# Patient Record
Sex: Male | Born: 1990 | Race: Black or African American | Hispanic: No | Marital: Single | Smoking: Current every day smoker
Health system: Southern US, Community
[De-identification: ages and names within clinical notes are randomized; demographics above are authoritative.]

## PROBLEM LIST (undated history)

## (undated) DIAGNOSIS — J45909 Unspecified asthma, uncomplicated: Secondary | ICD-10-CM

---
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
## 2011-01-16 ENCOUNTER — Encounter: Payer: Self-pay | Admitting: Internal Medicine

## 2011-01-21 ENCOUNTER — Other Ambulatory Visit: Payer: Commercial Managed Care - PPO | Admitting: Internal Medicine

## 2011-01-21 DIAGNOSIS — Z0000 Encounter for general adult medical examination without abnormal findings: Secondary | ICD-10-CM

## 2011-01-21 LAB — COMPREHENSIVE METABOLIC PANEL
ALT: 13 U/L (ref 0–53)
AST: 13 U/L (ref 0–37)
Albumin: 4.5 g/dL (ref 3.5–5.2)
Alkaline Phosphatase: 62 U/L (ref 39–117)
BUN: 14 mg/dL (ref 6–23)
Creat: 0.9 mg/dL (ref 0.50–1.35)
Total Bilirubin: 0.3 mg/dL (ref 0.3–1.2)
Total Protein: 7 g/dL (ref 6.0–8.3)

## 2011-01-21 LAB — CBC WITH DIFFERENTIAL/PLATELET
Basophils Absolute: 0 10*3/uL (ref 0.0–0.1)
Basophils Relative: 0 % (ref 0–1)
Eosinophils Absolute: 0.4 10*3/uL (ref 0.0–0.7)
Eosinophils Relative: 6 % — ABNORMAL HIGH (ref 0–5)
HCT: 45.2 % (ref 39.0–52.0)
Hemoglobin: 15.4 g/dL (ref 13.0–17.0)
Lymphs Abs: 2.7 10*3/uL (ref 0.7–4.0)
MCHC: 34.1 g/dL (ref 30.0–36.0)
MCV: 96.2 fL (ref 78.0–100.0)
Monocytes Absolute: 0.5 10*3/uL (ref 0.1–1.0)
Monocytes Relative: 7 % (ref 3–12)
Neutro Abs: 3.4 10*3/uL (ref 1.7–7.7)
Neutrophils Relative %: 48 % (ref 43–77)
Platelets: 241 10*3/uL (ref 150–400)
RBC: 4.7 MIL/uL (ref 4.22–5.81)
RDW: 14 % (ref 11.5–15.5)

## 2011-01-21 LAB — LIPID PANEL
Cholesterol: 133 mg/dL (ref 0–200)
HDL: 44 mg/dL (ref >39)
LDL Cholesterol: 81 mg/dL (ref 0–99)
Total CHOL/HDL Ratio: 3 Ratio
Triglycerides: 41 mg/dL (ref <150)

## 2011-01-22 ENCOUNTER — Ambulatory Visit (INDEPENDENT_AMBULATORY_CARE_PROVIDER_SITE_OTHER): Payer: Commercial Managed Care - PPO | Admitting: Internal Medicine

## 2011-01-22 ENCOUNTER — Encounter: Payer: Self-pay | Admitting: Internal Medicine

## 2011-01-22 VITALS — BP 118/72 | HR 82 | Temp 99.1°F | Ht 67.0 in | Wt 147.0 lb

## 2011-01-22 DIAGNOSIS — M25559 Pain in unspecified hip: Secondary | ICD-10-CM

## 2011-01-22 DIAGNOSIS — R52 Pain, unspecified: Secondary | ICD-10-CM

## 2011-01-22 DIAGNOSIS — Z0000 Encounter for general adult medical examination without abnormal findings: Secondary | ICD-10-CM

## 2011-01-22 DIAGNOSIS — Z23 Encounter for immunization: Secondary | ICD-10-CM

## 2011-02-10 ENCOUNTER — Encounter: Payer: Self-pay | Admitting: Internal Medicine

## 2011-02-10 DIAGNOSIS — J45909 Unspecified asthma, uncomplicated: Secondary | ICD-10-CM | POA: Insufficient documentation

## 2011-02-10 NOTE — Patient Instructions (Signed)
Takes Celebrex daily for 10 days. Call if hip pain does not resolve in 2 weeks. Please obtain x-ray of left hip. Order given. Please consider stopping smoking.

## 2011-02-10 NOTE — Progress Notes (Signed)
  Subjective:    Patient ID: Raymond Neal, male    DOB: 1990/10/06, 20 y.o.   MRN: [id number]  HPI first visit for this 20 year old male for health maintenance. History of asthma and uses albuterol inhaler when necessary. Smokes approximately 1/3 pack of cigarettes daily. Complaining of some  hip pain present for several weeks. No known injury. No known allergies.    Review of Systems aside from hip pain and asthma noncontributory     Objective:   Physical Exam  Vitals reviewed. Constitutional: He is oriented to person, place, and time. He appears well-developed and well-nourished.  HENT:  Head: Normocephalic and atraumatic.  Right Ear: External ear normal.  Left Ear: External ear normal.  Mouth/Throat: Oropharynx is clear and moist.  Eyes: EOM are normal. Pupils are equal, round, and reactive to light. No scleral icterus.  Neck: Neck supple. No thyromegaly present.  Cardiovascular: Normal rate, regular rhythm and normal heart sounds.   No murmur heard. Pulmonary/Chest: Effort normal and breath sounds normal. He has no wheezes. He has no rales.  Abdominal: Soft. Bowel sounds are normal. He exhibits no distension and no mass. There is no tenderness.  Genitourinary:       No hernia to direct palpation  Musculoskeletal:       Some pain with internal rotation of hip  Lymphadenopathy:    He has no cervical adenopathy.  Neurological: He is alert and oriented to person, place, and time. He has normal reflexes. No cranial nerve deficit. Coordination normal.  Skin: Skin is warm and dry. He is not diaphoretic.  Psychiatric: He has a normal mood and affect. His behavior is normal.          Assessment & Plan:  History of asthma  History of smoking  History of hip pain-likely musculoskeletal rule out occult hip problem with x-ray  Plan: Samples of Celebrex 200 mg daily for 10 days. Obtain x-ray of hip. To orthopedist if hip problem does not improve

## 2011-02-14 ENCOUNTER — Ambulatory Visit: Payer: Commercial Managed Care - PPO | Admitting: Internal Medicine

## 2011-10-20 ENCOUNTER — Emergency Department (HOSPITAL_COMMUNITY)
Admission: EM | Admit: 2011-10-20 | Discharge: 2011-10-20 | Disposition: A | Discharge status: Home / Self Care | Payer: Self-pay | Attending: Emergency Medicine | Admitting: Emergency Medicine

## 2011-10-20 ENCOUNTER — Encounter (HOSPITAL_COMMUNITY): Payer: Self-pay

## 2011-10-20 DIAGNOSIS — F17200 Nicotine dependence, unspecified, uncomplicated: Secondary | ICD-10-CM | POA: Insufficient documentation

## 2011-10-20 DIAGNOSIS — J45909 Unspecified asthma, uncomplicated: Secondary | ICD-10-CM | POA: Insufficient documentation

## 2011-10-20 HISTORY — DX: Unspecified asthma, uncomplicated: J45.909

## 2011-10-20 MED ORDER — ALBUTEROL SULFATE HFA 108 (90 BASE) MCG/ACT IN AERS: 1.0000 | INHALATION_SPRAY | Freq: Four times a day (QID) | RESPIRATORY_TRACT | Status: DC | PRN

## 2011-10-20 MED ORDER — ALBUTEROL SULFATE HFA 108 (90 BASE) MCG/ACT IN AERS
2.0000 | INHALATION_SPRAY | RESPIRATORY_TRACT | Status: DC | PRN
  Administered 2011-10-20: 2 via RESPIRATORY_TRACT
  Filled 2011-10-20: qty 6.7

## 2011-10-20 NOTE — ED Provider Notes (Signed)
History  Scribed for Ethelda Chick, MD, the patient was seen in room TR11C/TR11C. This chart was scribed by Candelaria Stagers. The patient's care started at 3:22 PM   CSN: [id number]  Arrival date & time 10/20/11  1420   First MD Initiated Contact with Patient 10/20/11 1501      Chief Complaint  Patient presents with  . Asthma     The history is provided by the patient.   Raymond Neal is a 21 y.o. male who presents to the Emergency Department complaining of asthma exacerbation after running out of his inhaler yesterday.  He is experiencing cough and sob.  He denies fever.  Pt states that he uses his inhaler almost everyday.  He is taking no other medications.  There are no other associated systemic symptoms, there are no other alleviating or modifying factors. No fever, no nasal congestion, no chest pain or difficulty breathing.   Past Medical History  Diagnosis Date  . Asthma     No past surgical history on file.  No family history on file.  History  Substance Use Topics  . Smoking status: Current Everyday Smoker -- 0.3 packs/day    Types: Cigarettes  . Smokeless tobacco: Not on file  . Alcohol Use: Not on file      Review of Systems  Constitutional: Negative for fever.  Respiratory: Positive for cough and shortness of breath.   All other systems reviewed and are negative.    Allergies  Review of patient's allergies indicates no known allergies.  Home Medications   Current Outpatient Rx  Name Route Sig Dispense Refill  . ALBUTEROL SULFATE HFA 108 (90 BASE) MCG/ACT IN AERS Inhalation Inhale 2 puffs into the lungs every 6 (six) hours as needed. For shortness of breath.    . ALBUTEROL SULFATE HFA 108 (90 BASE) MCG/ACT IN AERS Inhalation Inhale 1-2 puffs into the lungs every 6 (six) hours as needed for wheezing. 1 Inhaler 0    BP 117/76  Pulse 70  Temp 98.3 F (36.8 C) (Oral)  Resp 19  SpO2 99%  Physical Exam  Nursing note and vitals  reviewed. Constitutional: He is oriented to person, place, and time. He appears well-developed and well-nourished. No distress.  HENT:  Head: Normocephalic and atraumatic.  Mouth/Throat: No oropharyngeal exudate.  Eyes: Conjunctivae are normal.  Pulmonary/Chest: Breath sounds normal. He has no wheezes.       No increased respiratory effort, good breath movement.   Musculoskeletal: Normal range of motion. He exhibits no tenderness.  Neurological: He is alert and oriented to person, place, and time.  Skin: Skin is warm and dry. He is not diaphoretic.  Psychiatric: He has a normal mood and affect. His behavior is normal.    ED Course  Procedures  DIAGNOSTIC STUDIES: Oxygen Saturation is 99% on room air, normal by my interpretation.    COORDINATION OF CARE:     Labs Reviewed - No data to display No results found.   1. Asthma       MDM  Pt with hx of asthma presenting with c/o feeling that he needs his inhaler and he has run out.  No active wheezing or active asthma exacerbation at this time.  Pt given albuterol MDI in ED as well as rx for albuterol.  Discharged with strict return precautions.  Pt agreeable with plan.  I personally performed the services described in this documentation, which was scribed in my presence. The recorded information has been reviewed and  considered.       Ethelda Chick, MD 10/20/11 [phone]

## 2011-10-20 NOTE — ED Notes (Signed)
sts feels on the verge of asthma attack and wants to catch it before it gets bad, onset yesterday.

## 2011-11-25 ENCOUNTER — Other Ambulatory Visit: Payer: Self-pay | Admitting: Internal Medicine

## 2011-11-25 ENCOUNTER — Ambulatory Visit (INDEPENDENT_AMBULATORY_CARE_PROVIDER_SITE_OTHER): Payer: Commercial Managed Care - PPO | Admitting: Internal Medicine

## 2011-11-25 ENCOUNTER — Encounter: Payer: Self-pay | Admitting: Internal Medicine

## 2011-11-25 VITALS — BP 110/72 | HR 84 | Temp 97.9°F | Ht 67.5 in | Wt 147.0 lb

## 2011-11-25 DIAGNOSIS — N489 Disorder of penis, unspecified: Secondary | ICD-10-CM

## 2011-11-25 DIAGNOSIS — R21 Rash and other nonspecific skin eruption: Secondary | ICD-10-CM

## 2011-11-25 DIAGNOSIS — Z113 Encounter for screening for infections with a predominantly sexual mode of transmission: Secondary | ICD-10-CM

## 2011-11-25 NOTE — Patient Instructions (Addendum)
Recommend using condoms with sexual activity. Apply cream to distal penis twice daily until rash is healed

## 2011-11-25 NOTE — Progress Notes (Signed)
  Subjective:    Patient ID: Karlyne Greenspan, male    DOB: Feb 20, 1991, 21 y.o.   MRN: [id number]  HPI 21 year old sexually active black male in today with rash on the distal shaft of penis. Had onset on Friday, August 16. Says it was uncomfortable and irritated. Did not itch but was more painful. Never had this before. Recent sexual activity did not include use of condoms. Wants to be tested for STDs    Review of Systems     Objective:   Physical Exam he has healing excoriated lesions distal shaft of penis in a cluster. No frank ulcerations noted. No penile discharge. GC and Chlamydia probes were taken. Herpes culture taken.        Assessment & Plan:  Lesions distal shaft of penis-rule out herpes simplex type II. Could possibly be tinea cruris.  Check for STDs  Plan: GC and Chlamydia probes taken in addition to herpes culture and HIV drawn. Prescribed Lotrisone cream 30 g to use on rash twice a day with refills. Advise patient to use condoms.  Counsel regarding sexually transmitted diseases

## 2011-11-25 NOTE — Addendum Note (Signed)
Addended by: Judy Pimple on: 11/25/2011 12:46 PM   Modules accepted: Orders

## 2011-11-26 ENCOUNTER — Telehealth: Payer: Self-pay

## 2011-11-26 LAB — GC/CHLAMYDIA PROBE AMP, GENITAL: Chlamydia, DNA Probe: POSITIVE — AB

## 2011-11-26 LAB — HIV ANTIBODY (ROUTINE TESTING W REFLEX): HIV: NONREACTIVE

## 2011-11-26 MED ORDER — DOXYCYCLINE HYCLATE 100 MG PO TABS: 100.0000 mg | ORAL_TABLET | Freq: Two times a day (BID) | ORAL | Status: AC

## 2011-11-26 NOTE — Telephone Encounter (Signed)
Patient informed of positive Chlamydia test, and the need for Doxycycline 100 mg bid x 10 days. Scheduled for test of cure on 12/12/2011. Advised him to inform his partner of this as well

## 2011-11-26 NOTE — Addendum Note (Signed)
Addended by: Judy Pimple on: 11/26/2011 04:33 PM   Modules accepted: Orders

## 2011-12-12 ENCOUNTER — Telehealth: Payer: Self-pay | Admitting: Internal Medicine

## 2011-12-12 ENCOUNTER — Ambulatory Visit: Payer: Commercial Managed Care - PPO | Admitting: Internal Medicine

## 2011-12-12 NOTE — Telephone Encounter (Signed)
Patient was recently diagnosed with Chlamydia and was treated with doxycycline for 10 days. Health Department was notified. He failed to keep appointment today. I contacted him today. Says he has had car trouble and has had his license revoked. Reminded him he needed test of q. her for Chlamydia. 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 Told him that that could recur at any time and we could offer him prophylactic treatment. He says he will reschedule at a later date.

## 2012-01-07 ENCOUNTER — Ambulatory Visit: Payer: Commercial Managed Care - PPO | Admitting: Internal Medicine

## 2012-01-08 ENCOUNTER — Telehealth: Payer: Self-pay | Admitting: Internal Medicine

## 2012-01-08 NOTE — Telephone Encounter (Signed)
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 He was supposed to have returned before  now for test of cure for gonorrhea and chlamydia. A report was filled out in sent to the health department in August when he was diagnosed with both Chlamydia and gonorrhea and treated. Asked patient if he could stay for test of cure  today but he said he needed to go pick up his baby at daycare. Was given prescription for Valtrex 500 mg #30 one by mouth daily generic with when necessary one year refills to prevent outbreaks of HSV type II. Says he's had a couple of recurrent outbreaks since being diagnosed in August. He agreed to come by the following day, October 1 for test of cure but did not keep that appointment

## 2012-04-08 HISTORY — PX: CLAVICLE SURGERY: SHX598

## 2012-07-17 ENCOUNTER — Encounter (HOSPITAL_COMMUNITY): Payer: Self-pay | Admitting: Emergency Medicine

## 2012-07-17 ENCOUNTER — Emergency Department (HOSPITAL_COMMUNITY)
Admission: EM | Admit: 2012-07-17 | Discharge: 2012-07-17 | Disposition: A | Discharge status: Home / Self Care | Payer: Commercial Managed Care - PPO | Attending: Emergency Medicine | Admitting: Emergency Medicine

## 2012-07-17 DIAGNOSIS — J45909 Unspecified asthma, uncomplicated: Secondary | ICD-10-CM

## 2012-07-17 DIAGNOSIS — J45901 Unspecified asthma with (acute) exacerbation: Secondary | ICD-10-CM | POA: Insufficient documentation

## 2012-07-17 DIAGNOSIS — F17200 Nicotine dependence, unspecified, uncomplicated: Secondary | ICD-10-CM | POA: Insufficient documentation

## 2012-07-17 DIAGNOSIS — Z79899 Other long term (current) drug therapy: Secondary | ICD-10-CM | POA: Insufficient documentation

## 2012-07-17 DIAGNOSIS — R05 Cough: Secondary | ICD-10-CM | POA: Insufficient documentation

## 2012-07-17 DIAGNOSIS — R059 Cough, unspecified: Secondary | ICD-10-CM | POA: Insufficient documentation

## 2012-07-17 MED ORDER — ALBUTEROL SULFATE HFA 108 (90 BASE) MCG/ACT IN AERS
2.0000 | INHALATION_SPRAY | RESPIRATORY_TRACT | Status: DC | PRN
  Administered 2012-07-17: 2 via RESPIRATORY_TRACT
  Filled 2012-07-17: qty 6.7

## 2012-07-17 NOTE — ED Notes (Signed)
Pt discharged.Vital signs stable and GCS 15 

## 2012-07-17 NOTE — ED Notes (Signed)
PT. REPORTS ASTHMA ATTACK THIS EVENING WITH DRY COUGH  RAN OUT OF MDI TODAY .

## 2012-07-17 NOTE — ED Notes (Signed)
Pt presented to ED with the complaint of SOB last night but feels better now.He says he has run out on his medication and would like to refill them.

## 2012-07-17 NOTE — ED Provider Notes (Signed)
History     CSN: [id number]  Arrival date & time 07/17/12  0201   First MD Initiated Contact with Patient 07/17/12 0235      Chief Complaint  Patient presents with  . Asthma    (Consider location/radiation/quality/duration/timing/severity/associated sxs/prior treatment) HPI This is a 22 year old male with history of asthma. He has been having asthma attacks off and on for the past several days. They have been moderate in severity. He had been using his inhaler but this morning his inhaler ran out. He is only having mild dyspnea at the present time without wheezing but states he was wheezing earlier. He has had an occasional dry cough but denies fever or cold symptoms.  Past Medical History  Diagnosis Date  . Asthma     History reviewed. No pertinent past surgical history.  No family history on file.  History  Substance Use Topics  . Smoking status: Current Every Day Smoker -- 0.30 packs/day    Types: Cigarettes  . Smokeless tobacco: Not on file  . Alcohol Use: Not on file      Review of Systems  All other systems reviewed and are negative.    Allergies  Review of patient's allergies indicates no known allergies.  Home Medications   Current Outpatient Rx  Name  Route  Sig  Dispense  Refill  . albuterol (PROVENTIL HFA;VENTOLIN HFA) 108 (90 BASE) MCG/ACT inhaler   Inhalation   Inhale 2 puffs into the lungs every 6 (six) hours as needed. For shortness of breath.           BP 117/66  Pulse 70  Temp(Src) 97.7 F (36.5 C) (Oral)  Resp 16  SpO2 97%  Physical Exam General: Well-developed, well-nourished male in no acute distress; appearance consistent with age of record HENT: normocephalic, atraumatic; normal appearing oropharynx Eyes: pupils equal round and reactive to light; extraocular muscles intact Neck: supple Heart: regular rate and rhythm; no murmurs, rubs or gallops Lungs: mildly decreased air movement bilaterally without frank wheezing Abdomen:  soft; nondistended; nontender; no masses or hepatosplenomegaly Extremities: No deformity; full range of motion Neurologic: Awake, alert and oriented; motor function intact in all extremities and symmetric; no facial droop Skin: Warm and dry Psychiatric: Normal mood and affect    ED Course  Procedures (including critical care time)     MDM          Hanley Seamen, MD 07/17/12 [phone]

## 2012-09-08 ENCOUNTER — Encounter: Payer: Self-pay | Admitting: Internal Medicine

## 2012-10-02 ENCOUNTER — Emergency Department (HOSPITAL_COMMUNITY)
Admission: EM | Admit: 2012-10-02 | Discharge: 2012-10-02 | Disposition: A | Discharge status: Home / Self Care | Payer: Commercial Managed Care - PPO | Attending: Emergency Medicine | Admitting: Emergency Medicine

## 2012-10-02 ENCOUNTER — Encounter (HOSPITAL_COMMUNITY): Payer: Self-pay | Admitting: Emergency Medicine

## 2012-10-02 DIAGNOSIS — R0602 Shortness of breath: Secondary | ICD-10-CM | POA: Insufficient documentation

## 2012-10-02 DIAGNOSIS — J45901 Unspecified asthma with (acute) exacerbation: Secondary | ICD-10-CM | POA: Insufficient documentation

## 2012-10-02 DIAGNOSIS — R059 Cough, unspecified: Secondary | ICD-10-CM | POA: Insufficient documentation

## 2012-10-02 DIAGNOSIS — Z79899 Other long term (current) drug therapy: Secondary | ICD-10-CM | POA: Insufficient documentation

## 2012-10-02 DIAGNOSIS — J4531 Mild persistent asthma with (acute) exacerbation: Secondary | ICD-10-CM

## 2012-10-02 DIAGNOSIS — R062 Wheezing: Secondary | ICD-10-CM | POA: Insufficient documentation

## 2012-10-02 DIAGNOSIS — R05 Cough: Secondary | ICD-10-CM | POA: Insufficient documentation

## 2012-10-02 DIAGNOSIS — F17200 Nicotine dependence, unspecified, uncomplicated: Secondary | ICD-10-CM | POA: Insufficient documentation

## 2012-10-02 MED ORDER — ALBUTEROL SULFATE (5 MG/ML) 0.5% IN NEBU
5.0000 mg | INHALATION_SOLUTION | Freq: Once | RESPIRATORY_TRACT | Status: AC
  Administered 2012-10-02: 5 mg via RESPIRATORY_TRACT
  Filled 2012-10-02: qty 1

## 2012-10-02 MED ORDER — ALBUTEROL SULFATE HFA 108 (90 BASE) MCG/ACT IN AERS: 1.0000 | INHALATION_SPRAY | Freq: Four times a day (QID) | RESPIRATORY_TRACT | Status: DC | PRN

## 2012-10-02 MED ORDER — PREDNISONE 20 MG PO TABS
60.0000 mg | ORAL_TABLET | Freq: Once | ORAL | Status: AC
  Administered 2012-10-02: 60 mg via ORAL
  Filled 2012-10-02: qty 3

## 2012-10-02 MED ORDER — ALBUTEROL SULFATE HFA 108 (90 BASE) MCG/ACT IN AERS
2.0000 | INHALATION_SPRAY | RESPIRATORY_TRACT | Status: DC | PRN
  Administered 2012-10-02: 2 via RESPIRATORY_TRACT
  Filled 2012-10-02: qty 6.7

## 2012-10-02 MED ORDER — MONTELUKAST SODIUM 10 MG PO TABS: 10.0000 mg | ORAL_TABLET | Freq: Every day | ORAL | Status: DC

## 2012-10-02 MED ORDER — PREDNISONE 20 MG PO TABS: 40.0000 mg | ORAL_TABLET | Freq: Every day | ORAL | Status: DC

## 2012-10-02 NOTE — ED Provider Notes (Signed)
   History    CSN: [id number] Arrival date & time 10/02/12  0243  First MD Initiated Contact with Patient 10/02/12 0248     Chief Complaint  Patient presents with  . Respiratory Distress   (Consider location/radiation/quality/duration/timing/severity/associated sxs/prior Treatment) HPI History provided by patient. Onset yesterday of dry cough, shortness of breath and wheezing while working outside. Symptoms worse tonight. 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  No recent fevers chills or productive sputum. He required admission for asthma as a child. He has never required intubation. Symptoms moderate in severity. History of same - feels like his asthma.  Past Medical History  Diagnosis Date  . Asthma    History reviewed. No pertinent past surgical history. History reviewed. No pertinent family history. History  Substance Use Topics  . Smoking status: Current Every Day Smoker -- 0.50 packs/day for 2 years    Types: Cigarettes  . Smokeless tobacco: Never Used  . Alcohol Use: No    Review of Systems  Constitutional: Negative for fever and chills.  HENT: Negative for neck pain and neck stiffness.   Eyes: Negative for pain.  Respiratory: Positive for cough, shortness of breath and wheezing.   Cardiovascular: Negative for chest pain.  Gastrointestinal: Negative for abdominal pain.  Genitourinary: Negative for dysuria.  Musculoskeletal: Negative for back pain.  Skin: Negative for rash.  Neurological: Negative for headaches.  All other systems reviewed and are negative.    Allergies  Review of patient's allergies indicates no known allergies.  Home Medications   Current Outpatient Rx  Name  Route  Sig  Dispense  Refill  . albuterol (PROVENTIL HFA;VENTOLIN HFA) 108 (90 BASE) MCG/ACT inhaler   Inhalation   Inhale 2 puffs into the lungs every 6 (six) hours as needed. For shortness of  breath.          BP 124/83  Pulse 95  Temp(Src) 97.6 F (36.4 C) (Oral)  Resp 20  SpO2 100% Physical Exam  Constitutional: He is oriented to person, place, and time. He appears well-developed and well-nourished.  HENT:  Head: Normocephalic and atraumatic.  Eyes: EOM are normal. Pupils are equal, round, and reactive to light.  Neck: Neck supple.  Cardiovascular: Normal rate, regular rhythm and intact distal pulses.   Pulmonary/Chest: Effort normal. No stridor. No respiratory distress.  Prolonged expiration with bilateral expiratory wheezes  Abdominal: Soft. There is no tenderness.  Musculoskeletal: Normal range of motion. He exhibits no edema.  Neurological: He is alert and oriented to person, place, and time.  Skin: Skin is warm and dry.    ED Course  Procedures (including critical care time)  Steroids and albuterol provided  3:52 AM on recheck is moving air well with wheezes resolved. Albuterol inhaler provided and plan followup with primary care physician. Prescription for albuterol, steroids and Singulair provided  All discharge and followup instructions verbalized is understood.  MDM  Acute asthma exacerbation. Improved with albuterol nebulized treatment and steroids Vital signs and nursing notes reviewed and considered  Sunnie Nielsen, MD 10/02/12 [phone]

## 2012-10-02 NOTE — ED Notes (Signed)
Pt woke up out of sleep SOB, wheezing and coughing.  Tried to use rescue inhaler without change.  Hx. Of asthma

## 2012-10-15 ENCOUNTER — Encounter: Payer: Self-pay | Admitting: Internal Medicine

## 2012-11-08 ENCOUNTER — Emergency Department (HOSPITAL_COMMUNITY): Payer: Commercial Managed Care - PPO

## 2012-11-08 ENCOUNTER — Emergency Department (HOSPITAL_COMMUNITY)
Admission: EM | Admit: 2012-11-08 | Discharge: 2012-11-08 | Disposition: A | Discharge status: Home / Self Care | Payer: Commercial Managed Care - PPO | Attending: Emergency Medicine | Admitting: Emergency Medicine

## 2012-11-08 ENCOUNTER — Encounter (HOSPITAL_COMMUNITY): Payer: Self-pay | Admitting: Emergency Medicine

## 2012-11-08 DIAGNOSIS — J45901 Unspecified asthma with (acute) exacerbation: Secondary | ICD-10-CM | POA: Insufficient documentation

## 2012-11-08 DIAGNOSIS — F17200 Nicotine dependence, unspecified, uncomplicated: Secondary | ICD-10-CM | POA: Insufficient documentation

## 2012-11-08 DIAGNOSIS — Z79899 Other long term (current) drug therapy: Secondary | ICD-10-CM | POA: Insufficient documentation

## 2012-11-08 DIAGNOSIS — R0602 Shortness of breath: Secondary | ICD-10-CM | POA: Insufficient documentation

## 2012-11-08 MED ORDER — PREDNISONE 20 MG PO TABS
60.0000 mg | ORAL_TABLET | Freq: Once | ORAL | Status: AC
  Administered 2012-11-08: 60 mg via ORAL
  Filled 2012-11-08: qty 3

## 2012-11-08 MED ORDER — IPRATROPIUM BROMIDE 0.02 % IN SOLN
0.5000 mg | Freq: Once | RESPIRATORY_TRACT | Status: AC
  Administered 2012-11-08: 0.5 mg via RESPIRATORY_TRACT
  Filled 2012-11-08: qty 2.5

## 2012-11-08 MED ORDER — PREDNISONE 20 MG PO TABS: 60.0000 mg | ORAL_TABLET | Freq: Every day | ORAL | Status: DC

## 2012-11-08 MED ORDER — ALBUTEROL SULFATE (5 MG/ML) 0.5% IN NEBU
5.0000 mg | INHALATION_SOLUTION | Freq: Once | RESPIRATORY_TRACT | Status: AC
  Administered 2012-11-08: 5 mg via RESPIRATORY_TRACT
  Filled 2012-11-08: qty 1

## 2012-11-08 MED ORDER — ALBUTEROL SULFATE HFA 108 (90 BASE) MCG/ACT IN AERS: 2.0000 | INHALATION_SPRAY | RESPIRATORY_TRACT | Status: DC | PRN

## 2012-11-08 NOTE — ED Provider Notes (Signed)
CSN: [id number]     Arrival date & time 11/08/12  1037 History     First MD Initiated Contact with Patient 11/08/12 1041     Chief Complaint  Patient presents with  . Cough  . Asthma   (Consider location/radiation/quality/duration/timing/severity/associated sxs/prior Treatment) Patient is a 22 y.o. male presenting with cough and asthma. The history is provided by the patient.  Cough Associated symptoms: shortness of breath and wheezing   Associated symptoms: no chest pain, no chills, no fever, no headaches and no rash   Asthma Associated symptoms include shortness of breath. Pertinent negatives include no chest pain, no abdominal pain and no headaches.  pt w hx asthma c/o exacerbation of his asthma. In pas few days notes increased wheezing, sob. Also notes recent increase in non prod cough. No fever or chills. No chest pain. Feels same as prior asthma exacerbations, but coughing more. No sore throat or runny nose. No headache. No nv. Wheezing persistent, transient improvement w inhaler use. Last on pred 1 month ago. No hospitalization since teenager.     Past Medical History  Diagnosis Date  . Asthma    History reviewed. No pertinent past surgical history. History reviewed. No pertinent family history. History  Substance Use Topics  . Smoking status: Current Every Day Smoker -- 0.50 packs/day for 2 years    Types: Cigarettes  . Smokeless tobacco: Never Used  . Alcohol Use: No    Review of Systems  Constitutional: Negative for fever and chills.  HENT: Negative for neck pain.   Eyes: Negative for redness.  Respiratory: Positive for cough, shortness of breath and wheezing.   Cardiovascular: Negative for chest pain and leg swelling.  Gastrointestinal: Negative for abdominal pain.  Genitourinary: Negative for flank pain.  Musculoskeletal: Negative for back pain.  Skin: Negative for rash.  Neurological: Negative for headaches.  Hematological: Does not bruise/bleed easily.   Psychiatric/Behavioral: Negative for confusion.    Allergies  Review of patient's allergies indicates no known allergies.  Home Medications   Current Outpatient Rx  Name  Route  Sig  Dispense  Refill  . albuterol (PROVENTIL HFA;VENTOLIN HFA) 108 (90 BASE) MCG/ACT inhaler   Inhalation   Inhale 2 puffs into the lungs every 6 (six) hours as needed. For shortness of breath.         Marland Kitchen albuterol (PROVENTIL HFA;VENTOLIN HFA) 108 (90 BASE) MCG/ACT inhaler   Inhalation   Inhale 1-2 puffs into the lungs every 6 (six) hours as needed for wheezing.   1 Inhaler   0   . montelukast (SINGULAIR) 10 MG tablet   Oral   Take 1 tablet (10 mg total) by mouth at bedtime.   30 tablet   0   . predniSONE (DELTASONE) 20 MG tablet   Oral   Take 2 tablets (40 mg total) by mouth daily.   10 tablet   0    BP 120/77  Pulse 112  Temp(Src) 98.1 F (36.7 C) (Oral)  Resp 20  SpO2 95% Physical Exam  Nursing note and vitals reviewed. Constitutional: He is oriented to person, place, and time. He appears well-developed and well-nourished. No distress.  HENT:  Nose: Nose normal.  Mouth/Throat: Oropharynx is clear and moist.  Eyes: Conjunctivae are normal. No scleral icterus.  Neck: Neck supple. No tracheal deviation present.  Cardiovascular: Regular rhythm, normal heart sounds and intact distal pulses.  Exam reveals no gallop and no friction rub.   No murmur heard. Pulmonary/Chest: Effort normal. No accessory  muscle usage. No respiratory distress. He has wheezes.  Abdominal: He exhibits no distension. There is no tenderness.  Musculoskeletal: Normal range of motion. He exhibits no edema and no tenderness.  Neurological: He is alert and oriented to person, place, and time.  Skin: Skin is warm and dry.  Psychiatric: He has a normal mood and affect.    ED Course   Procedures (including critical care time)  Dg Chest 2 View  11/08/2012   *RADIOLOGY REPORT*  Clinical Data: Smoker with current  history of asthma, presenting with chest pain and congestion over the past 4 days.  CHEST - 2 VIEW  Comparison: Two-view chest x-ray 12/17/2005, 12/11/2003.  Findings: Cardiomediastinal silhouette unremarkable, unchanged. Marked hyperinflation, even more so than on the prior examinations. Mild central peribronchial thickening, unchanged.  Lungs otherwise clear.  No pneumothorax.  No pleural effusions.  Visualized bony thorax intact.  IMPRESSION: Marked hyperinflation and mild central peribronchial thickening are consistent with bronchitis and/or asthma.  No acute cardiopulmonary disease otherwise.   Original Report Authenticated By: Hulan Saas, M.D.      MDM  Albuterol and atrovent neb. Prednisone.   Reviewed nursing notes and prior charts for additional history.   Recheck persistent wheezing. Improved air exchange.  Albuterol and atrovent neb.  Recheck breathing comfortably. No fever. No pna on cxr.  Pt appears stable for d/c.  w asthma exacerbation last month and this, will also give referral to pulm for outpt follow up with them.    Suzi Roots, MD 11/08/12 [phone]

## 2012-11-08 NOTE — ED Notes (Signed)
Pt states cough since Thursday. Pt states cough has made his asthma worse. Pt states chest sore from coughing. Pt denies pain otherwise. Pt states mucus yellow. Pt alert and mentating appropriately. Speaking in clear complete sentences. Pt denies fevers. Pt family states "yeah he has be chill all weekend." Pt denies numbness and tingling. Pt denies n/v/d.

## 2012-11-08 NOTE — ED Notes (Signed)
Respiratory notified pt needs breathing treatment. Verifies they will come give pt treatment

## 2012-11-08 NOTE — ED Notes (Signed)
DR Denton Lank at bedside

## 2012-11-08 NOTE — ED Notes (Signed)
Respiratory at bedside.

## 2012-11-13 ENCOUNTER — Institutional Professional Consult (permissible substitution): Payer: Commercial Managed Care - PPO | Admitting: Pulmonary Disease

## 2012-11-20 ENCOUNTER — Institutional Professional Consult (permissible substitution): Payer: Commercial Managed Care - PPO | Admitting: Internal Medicine

## 2013-02-04 ENCOUNTER — Emergency Department (HOSPITAL_COMMUNITY): Payer: Commercial Managed Care - PPO

## 2013-02-04 ENCOUNTER — Emergency Department (HOSPITAL_COMMUNITY)
Admission: EM | Admit: 2013-02-04 | Discharge: 2013-02-04 | Disposition: A | Discharge status: Home / Self Care | Payer: Commercial Managed Care - PPO | Attending: Emergency Medicine | Admitting: Emergency Medicine

## 2013-02-04 ENCOUNTER — Encounter (HOSPITAL_COMMUNITY): Payer: Self-pay | Admitting: Emergency Medicine

## 2013-02-04 DIAGNOSIS — F17200 Nicotine dependence, unspecified, uncomplicated: Secondary | ICD-10-CM | POA: Insufficient documentation

## 2013-02-04 DIAGNOSIS — J45909 Unspecified asthma, uncomplicated: Secondary | ICD-10-CM | POA: Insufficient documentation

## 2013-02-04 DIAGNOSIS — S43016A Anterior dislocation of unspecified humerus, initial encounter: Secondary | ICD-10-CM | POA: Insufficient documentation

## 2013-02-04 DIAGNOSIS — S0003XA Contusion of scalp, initial encounter: Secondary | ICD-10-CM | POA: Insufficient documentation

## 2013-02-04 DIAGNOSIS — S43004A Unspecified dislocation of right shoulder joint, initial encounter: Secondary | ICD-10-CM

## 2013-02-04 DIAGNOSIS — Z79899 Other long term (current) drug therapy: Secondary | ICD-10-CM | POA: Insufficient documentation

## 2013-02-04 MED ORDER — ALBUTEROL SULFATE HFA 108 (90 BASE) MCG/ACT IN AERS
1.0000 | INHALATION_SPRAY | Freq: Four times a day (QID) | RESPIRATORY_TRACT | Status: DC | PRN
  Filled 2013-02-04: qty 6.7

## 2013-02-04 MED ORDER — HYDROCODONE-ACETAMINOPHEN 5-325 MG PO TABS
1.0000 | ORAL_TABLET | Freq: Once | ORAL | Status: AC
  Administered 2013-02-04: 1 via ORAL
  Filled 2013-02-04: qty 1

## 2013-02-04 MED ORDER — HYDROCODONE-ACETAMINOPHEN 5-325 MG PO TABS: 1.0000 | ORAL_TABLET | Freq: Four times a day (QID) | ORAL | Status: DC | PRN

## 2013-02-04 NOTE — ED Notes (Signed)
Pt c/o R shoulder injury after a fall at work.  Pain score 9/10.  Pt sts he tripped and fell at work.  Deformity noted.  Denies numbness.
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
## 2013-07-13 ENCOUNTER — Emergency Department (HOSPITAL_COMMUNITY)
Admission: EM | Admit: 2013-07-13 | Discharge: 2013-07-13 | Disposition: A | Discharge status: Home / Self Care | Payer: Commercial Managed Care - PPO | Attending: Emergency Medicine | Admitting: Emergency Medicine

## 2013-07-13 ENCOUNTER — Emergency Department (HOSPITAL_COMMUNITY): Payer: Commercial Managed Care - PPO

## 2013-07-13 ENCOUNTER — Encounter (HOSPITAL_COMMUNITY): Payer: Self-pay | Admitting: Emergency Medicine

## 2013-07-13 ENCOUNTER — Inpatient Hospital Stay (HOSPITAL_COMMUNITY)
Admission: EM | Admit: 2013-07-13 | Discharge: 2013-07-14 | DRG: 203 | Disposition: A | Discharge status: Home / Self Care | Payer: Self-pay | Attending: Internal Medicine | Admitting: Internal Medicine

## 2013-07-13 DIAGNOSIS — F17200 Nicotine dependence, unspecified, uncomplicated: Secondary | ICD-10-CM | POA: Diagnosis present

## 2013-07-13 DIAGNOSIS — J209 Acute bronchitis, unspecified: Secondary | ICD-10-CM | POA: Diagnosis present

## 2013-07-13 DIAGNOSIS — R000 Tachycardia, unspecified: Secondary | ICD-10-CM | POA: Diagnosis present

## 2013-07-13 DIAGNOSIS — J45901 Unspecified asthma with (acute) exacerbation: Principal | ICD-10-CM | POA: Diagnosis present

## 2013-07-13 DIAGNOSIS — R05 Cough: Secondary | ICD-10-CM | POA: Insufficient documentation

## 2013-07-13 DIAGNOSIS — Z79899 Other long term (current) drug therapy: Secondary | ICD-10-CM

## 2013-07-13 DIAGNOSIS — J45902 Unspecified asthma with status asthmaticus: Secondary | ICD-10-CM

## 2013-07-13 DIAGNOSIS — R059 Cough, unspecified: Secondary | ICD-10-CM | POA: Insufficient documentation

## 2013-07-13 DIAGNOSIS — Z720 Tobacco use: Secondary | ICD-10-CM

## 2013-07-13 DIAGNOSIS — F1210 Cannabis abuse, uncomplicated: Secondary | ICD-10-CM | POA: Diagnosis present

## 2013-07-13 LAB — CBC
HEMATOCRIT: 43 % (ref 39.0–52.0)
HEMOGLOBIN: 15.8 g/dL (ref 13.0–17.0)
MCH: 33.5 pg (ref 26.0–34.0)
MCHC: 36.7 g/dL — ABNORMAL HIGH (ref 30.0–36.0)
MCV: 91.3 fL (ref 78.0–100.0)
Platelets: 200 10*3/uL (ref 150–400)
RBC: 4.71 MIL/uL (ref 4.22–5.81)
RDW: 13.7 % (ref 11.5–15.5)
WBC: 11.7 10*3/uL — AB (ref 4.0–10.5)

## 2013-07-13 LAB — BASIC METABOLIC PANEL
BUN: 8 mg/dL (ref 6–23)
CHLORIDE: 102 meq/L (ref 96–112)
CO2: 23 mEq/L (ref 19–32)
Calcium: 9.5 mg/dL (ref 8.4–10.5)
Creatinine, Ser: 0.69 mg/dL (ref 0.50–1.35)
GFR calc Af Amer: >90 mL/min (ref >90)
GFR calc non Af Amer: >90 mL/min (ref >90)
GLUCOSE: 124 mg/dL — AB (ref 70–99)
POTASSIUM: 3.2 meq/L — AB (ref 3.7–5.3)
SODIUM: 144 meq/L (ref 137–147)

## 2013-07-13 LAB — RAPID URINE DRUG SCREEN, HOSP PERFORMED
AMPHETAMINES: NOT DETECTED
Barbiturates: NOT DETECTED
Benzodiazepines: NOT DETECTED
Cocaine: NOT DETECTED
Opiates: NOT DETECTED
TETRAHYDROCANNABINOL: POSITIVE — AB

## 2013-07-13 MED ORDER — ACETAMINOPHEN 650 MG RE SUPP
650.0000 mg | Freq: Four times a day (QID) | RECTAL | Status: DC | PRN
Start: 2013-07-13 — End: 2013-07-14

## 2013-07-13 MED ORDER — METHYLPREDNISOLONE SODIUM SUCC 125 MG IJ SOLR
60.0000 mg | Freq: Two times a day (BID) | INTRAMUSCULAR | Status: DC
  Administered 2013-07-13 – 2013-07-14 (×2): 60 mg via INTRAVENOUS
  Filled 2013-07-13 (×2): qty 0.96
  Filled 2013-07-13: qty 2
  Filled 2013-07-13 (×2): qty 0.96

## 2013-07-13 MED ORDER — LEVOFLOXACIN 750 MG PO TABS
750.0000 mg | ORAL_TABLET | Freq: Every day | ORAL | Status: DC
  Administered 2013-07-13 – 2013-07-14 (×2): 750 mg via ORAL
  Filled 2013-07-13 (×2): qty 1

## 2013-07-13 MED ORDER — ALBUTEROL SULFATE (2.5 MG/3ML) 0.083% IN NEBU
5.0000 mg | INHALATION_SOLUTION | Freq: Once | RESPIRATORY_TRACT | Status: AC
  Administered 2013-07-13: 5 mg via RESPIRATORY_TRACT
  Filled 2013-07-13: qty 6

## 2013-07-13 MED ORDER — ACETAMINOPHEN 325 MG PO TABS
650.0000 mg | ORAL_TABLET | Freq: Four times a day (QID) | ORAL | Status: DC | PRN
Start: 2013-07-13 — End: 2013-07-14
  Administered 2013-07-13: 650 mg via ORAL
  Filled 2013-07-13: qty 2

## 2013-07-13 MED ORDER — ALBUTEROL SULFATE (2.5 MG/3ML) 0.083% IN NEBU
INHALATION_SOLUTION | RESPIRATORY_TRACT | Status: AC
  Filled 2013-07-13: qty 12

## 2013-07-13 MED ORDER — ALBUTEROL (5 MG/ML) CONTINUOUS INHALATION SOLN
INHALATION_SOLUTION | RESPIRATORY_TRACT | Status: AC
  Filled 2013-07-13: qty 20

## 2013-07-13 MED ORDER — ALBUTEROL SULFATE (2.5 MG/3ML) 0.083% IN NEBU: 2.5000 mg | INHALATION_SOLUTION | RESPIRATORY_TRACT | Status: DC

## 2013-07-13 MED ORDER — PREDNISONE 20 MG PO TABS
60.0000 mg | ORAL_TABLET | Freq: Once | ORAL | Status: AC
  Administered 2013-07-13: 60 mg via ORAL
  Filled 2013-07-13: qty 3

## 2013-07-13 MED ORDER — ONDANSETRON HCL 4 MG PO TABS: 4.0000 mg | ORAL_TABLET | Freq: Four times a day (QID) | ORAL | Status: DC | PRN

## 2013-07-13 MED ORDER — ALBUTEROL SULFATE (2.5 MG/3ML) 0.083% IN NEBU
2.5000 mg | INHALATION_SOLUTION | RESPIRATORY_TRACT | Status: DC | PRN
  Administered 2013-07-13 – 2013-07-14 (×3): 2.5 mg via RESPIRATORY_TRACT
  Filled 2013-07-13 (×4): qty 3

## 2013-07-13 MED ORDER — ALBUTEROL SULFATE (2.5 MG/3ML) 0.083% IN NEBU
10.0000 mg | INHALATION_SOLUTION | Freq: Once | RESPIRATORY_TRACT | Status: AC
  Administered 2013-07-13: 10 mg via RESPIRATORY_TRACT
  Filled 2013-07-13 (×2): qty 12

## 2013-07-13 MED ORDER — SODIUM CHLORIDE 0.9 % IV SOLN
INTRAVENOUS | Status: DC
  Administered 2013-07-13 (×2): 1000 mL via INTRAVENOUS

## 2013-07-13 MED ORDER — ALBUTEROL SULFATE (2.5 MG/3ML) 0.083% IN NEBU
2.5000 mg | INHALATION_SOLUTION | Freq: Four times a day (QID) | RESPIRATORY_TRACT | Status: DC
  Administered 2013-07-13 – 2013-07-14 (×5): 2.5 mg via RESPIRATORY_TRACT
  Filled 2013-07-13 (×5): qty 3

## 2013-07-13 MED ORDER — ONDANSETRON HCL 4 MG/2ML IJ SOLN: 4.0000 mg | Freq: Four times a day (QID) | INTRAMUSCULAR | Status: DC | PRN

## 2013-07-13 MED ORDER — ENOXAPARIN SODIUM 40 MG/0.4ML ~~LOC~~ SOLN
40.0000 mg | SUBCUTANEOUS | Status: DC
  Filled 2013-07-13 (×2): qty 0.4

## 2013-07-13 MED ORDER — SODIUM CHLORIDE 0.9 % IJ SOLN
3.0000 mL | Freq: Two times a day (BID) | INTRAMUSCULAR | Status: DC
  Administered 2013-07-13 – 2013-07-14 (×2): 3 mL via INTRAVENOUS

## 2013-07-13 MED ORDER — PNEUMOCOCCAL VAC POLYVALENT 25 MCG/0.5ML IJ INJ
0.5000 mL | INJECTION | INTRAMUSCULAR | Status: DC
  Filled 2013-07-13: qty 0.5

## 2013-07-13 NOTE — ED Notes (Signed)
Nurse explained process / wait time and delay to pt. Respirations unlabored . No distress.

## 2013-07-13 NOTE — ED Provider Notes (Addendum)
CSN: [id number]     Arrival date & time 07/13/13  0420 History   First MD Initiated Contact with Patient 07/13/13 [phone]     Chief Complaint  Patient presents with  . Cough     (Consider location/radiation/quality/duration/timing/severity/associated sxs/prior Treatment) HPI Patient is a 23 yo man with asthma who has required multiple admissions in the past for asthma/status asthmaticus. He comes in tonight with several hours of wheezing, productive cough and SOB. He feels he is suffering from an acute asthma exacerbation. He is using his rescue inhaler to no avail. The patient also takes Singulair regularly.  The patient received a single albuterol nebulizer treatment while in triage. He said this was helpful but not adequate. He denies fever. He has some chest tightness. He currently smokes one half per day of cigarettes  Past Medical History  Diagnosis Date  . Asthma    History reviewed. No pertinent past surgical history. No family history on file. History  Substance Use Topics  . Smoking status: Current Every Day Smoker -- 0.50 packs/day for 2 years    Types: Cigarettes  . Smokeless tobacco: Never Used  . Alcohol Use: No    Review of Systems Ten point review of symptoms performed and is negative with the exception of symptoms noted above.     Allergies  Review of patient's allergies indicates no known allergies.  Home Medications   Current Outpatient Rx  Name  Route  Sig  Dispense  Refill  . albuterol (PROVENTIL HFA;VENTOLIN HFA) 108 (90 BASE) MCG/ACT inhaler   Inhalation   Inhale 2 puffs into the lungs every 6 (six) hours as needed. For shortness of breath.         Marland Kitchen. HYDROcodone-acetaminophen (NORCO/VICODIN) 5-325 MG per tablet   Oral   Take 1 tablet by mouth every 6 (six) hours as needed for pain.   20 tablet   0   . montelukast (SINGULAIR) 10 MG tablet   Oral   Take 1 tablet (10 mg total) by mouth at bedtime.   30 tablet   0    BP 134/73  Pulse 107   Temp(Src) 98.1 F (36.7 C) (Oral)  Resp 20  SpO2 97% Physical Exam Gen: well developed and well nourished appearing Head: NCAT Eyes: PERL, EOMI Nose: no epistaixis or rhinorrhea Mouth/throat: mucosa is moist and pink Neck: supple, no stridor Lungs: poor air exchange bilaterally, diffuse high pitched wheezing, RR 28/min. CV: rapid and regular - 108 bpm, no murmur, extremities appear well perfused.  Abd: soft, notender, nondistended Back: no ttp, no cva ttp Skin: warm and dry Ext: normal to inspection, no dependent edema Neuro: CN ii-xii grossly intact, no focal deficits Psyche; normal affect,  calm and cooperative.   ED Course  Procedures (including critical care time) Labs Review Labs Reviewed - No data to display Imaging Review Dg Chest 2 View  07/13/2013   CLINICAL DATA:  Cough and congestion.  EXAM: CHEST  2 VIEW  COMPARISON:  11/08/2012  FINDINGS: Lungs are hyperexpanded. There is no consolidation or effusion. Cardiomediastinal silhouette and remainder the exam is unchanged.  IMPRESSION: No active cardiopulmonary disease.   Electronically Signed   By: Elberta Fortisaniel  Boyle M.D.   On: 07/13/2013 02:58      MDM   Patient with status asthmaticus. We will tx with Albuterol 10mg  SVN over 60 minutes and prednisone then re-evaluate for disposition.    Brandt LoosenJulie Kelsey Durflinger, MD 07/13/13 [phone]  0700: patient re-examined. Feels a little better but, pulmonary  exam is essentially unchanged. Plan to admit for status asthmaticus.   Brandt Loosen, MD 07/13/13 [phone]

## 2013-07-13 NOTE — H&P (Signed)
History and Physical  Raymond GreenspanJohn W Neal WUJ:[id number] DOB: 14-Jul-1990 DOA: 07/13/2013   PCP: Raymond Neal,[name], MD   Chief Complaint: Shortness of breath  HPI:  23 year old male with a history of asthma presents with one to 2 day history of URI type symptoms with increasing cough and yellow sputum production. He developed increasing shortness of breath which prompted him to come to the emergency department. He also had 2 episodes of nausea and vomiting on the day prior to admission. He has not had any diarrhea, abdominal pain, hematemesis, hematochezia, melena. He denied any fevers, chills, hemoptysis abdominal pain, dysuria, hematuria, headache, syncope, dizziness. He has not been admitted to the hospital for asthma exacerbation oriented in the past. He has not had any nighttime symptoms, and usually does not need to use his rescue inhaler on today. The patient currently is trying to quit smoking. He still smokes one cigarette per day. He previously smoked one pack per day  In ED, the patient was noted to have diffuse wheezing. The patient was given a one-hour nebulizer with improvement of his breathing, but the patient continued to have significant wheezing. As a result admission was requested. He is not in any respiratory distress at the time of my examination, and there is use of accessory muscles of respiration. Chest x-ray was negative for infiltrates. BMP and CBC results are pending at the time of my dictation. Assessment/Plan: Acute asthma exacerbation -Continue albuterol every 4 hours -Albuterol every 2 hours when necessary -Solu-Medrol 60 mg IV every 12 hours -As the patient has increasing cough and sputum production, we'll start levofloxacin 750 mg daily Tachycardia -Appears to be sinus on telemetry -Obtain EKG -Likely related to the patient's nebulizer although the patient was mildly tachycardic prior to -Urine drug screen -1 L normal saline -Await results of BMP and CBC -Placed on  telemetry Tobacco abuse  -Tobacco cessation discussed        Past Medical History  Diagnosis Date  . Asthma    History reviewed. No pertinent past surgical history. Social History:  reports that he has been smoking Cigarettes.  He has a 1 pack-year smoking history. He has never used smokeless tobacco. He reports that he uses illicit drugs (Marijuana) about once per week. He reports that he does not drink alcohol.   Family history reviewed. No pertinent family history    No Known Allergies    Prior to Admission medications   Medication Sig Start Date End Date Taking? Authorizing Provider  albuterol (PROVENTIL HFA;VENTOLIN HFA) 108 (90 BASE) MCG/ACT inhaler Inhale 2 puffs into the lungs every 8 (eight) hours as needed for shortness of breath. For shortness of breath.   Yes Historical Provider, MD    Review of Systems:  Constitutional:  No weight loss, night sweats, Fevers, chills, fatigue.  Head&Eyes: No headache.  No vision loss.  No eye pain or scotoma ENT:  No Difficulty swallowing,Tooth/dental problems,Sore throat,  No ear ache, post nasal drip,  Cardio-vascular:  No chest pain, Orthopnea, PND, swelling in lower extremities,  dizziness, palpitations  GI:  No  abdominal pain,diarrhea, loss of appetite, hematochezia, melena, heartburn, indigestion, Resp:  No coughing up of blood .No wheezing.No chest wall deformity  Skin:  no rash or lesions.  GU:  no dysuria, change in color of urine, no urgency or frequency. No flank pain.  Musculoskeletal:  No joint pain or swelling. No decreased range of motion. No back pain.  Psych:  No change in mood or affect.  No depression or anxiety. Neurologic: No headache, no dysesthesia, no focal weakness, no vision loss. No syncope  Physical Exam: Filed Vitals:   07/13/13 0600 07/13/13 0614 07/13/13 0630 07/13/13 0700  BP: 125/70  130/80 138/87  Pulse: 123  123 129  Temp:      TempSrc:      Resp:      SpO2: 94% 97% 100% 98%     General:  A&O x 3, NAD, nontoxic, pleasant/cooperative Head/Eye: No conjunctival hemorrhage, no icterus, Raymond Neal/AT, No nystagmus ENT:  No icterus,  No thrush, good dentition, no pharyngeal exudate Neck:  No masses, no lymphadenpathy, no bruits CV:  RRR, no rub, no gallop, no S3 Lung:  Bibasilar rales with scattered bilateral wheezing. Good air movement. Abdomen: soft/NT, +BS, nondistended, no peritoneal signs Ext: No cyanosis, No rashes, No petechiae, No lymphangitis, No edema   Labs on Admission:  Basic Metabolic Panel: No results found for this basename: NA, K, CL, CO2, GLUCOSE, BUN, CREATININE, CALCIUM, MG, PHOS,  in the last 168 hours Liver Function Tests: No results found for this basename: AST, ALT, ALKPHOS, BILITOT, PROT, ALBUMIN,  in the last 168 hours No results found for this basename: LIPASE, AMYLASE,  in the last 168 hours No results found for this basename: AMMONIA,  in the last 168 hours CBC: No results found for this basename: WBC, NEUTROABS, HGB, HCT, MCV, PLT,  in the last 168 hours Cardiac Enzymes: No results found for this basename: CKTOTAL, CKMB, CKMBINDEX, TROPONINI,  in the last 168 hours BNP: No components found with this basename: POCBNP,  CBG: No results found for this basename: GLUCAP,  in the last 168 hours  Radiological Exams on Admission: Dg Chest 2 View  07/13/2013   CLINICAL DATA:  Cough and congestion.  EXAM: CHEST  2 VIEW  COMPARISON:  11/08/2012  FINDINGS: Lungs are hyperexpanded. There is no consolidation or effusion. Cardiomediastinal silhouette and remainder the exam is unchanged.  IMPRESSION: No active cardiopulmonary disease.   Electronically Signed   By: Elberta Fortis M.D.   On: 07/13/2013 02:58    EKG: Independently reviewed. pending    Time spent:50 minutes Code Status:   FULL Family Communication:   No Family at bedside   Raymond Woodring, DO  [facility] Pager [phone]  If 7PM-7AM, please contact  night-coverage [email or web address] Password [facility] 07/13/2013, 7:55 AM

## 2013-07-13 NOTE — ED Notes (Signed)
Pt. reports productive cough / wheezing , chest congestion onset yesterday morning .

## 2013-07-13 NOTE — ED Notes (Addendum)
Pt. reports persistent productive cough , pt. left after triage this morning after receiving Allbuterol nebulizer treatment and chest x-ray.
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
## 2013-07-13 NOTE — ED Notes (Signed)
Hr long neb in progress.  Lights turned down, pt resting, covered in blankets.

## 2013-07-13 NOTE — Progress Notes (Signed)
Patient trasfered from 2C02 to 5W11 via stretcher; alert and oriented x 4; no complaints of pain; IV saline locked in RFA;  skin intact. Orient patient to room and unit; gave patient care guide; instructed how to use the call bell and  fall risk precautions. Will continue to monitor the patient.

## 2013-07-13 NOTE — ED Notes (Signed)
MD at bedside. 

## 2013-07-14 DIAGNOSIS — J45902 Unspecified asthma with status asthmaticus: Secondary | ICD-10-CM

## 2013-07-14 DIAGNOSIS — J209 Acute bronchitis, unspecified: Secondary | ICD-10-CM | POA: Diagnosis present

## 2013-07-14 LAB — BASIC METABOLIC PANEL
BUN: 9 mg/dL (ref 6–23)
CO2: 21 mEq/L (ref 19–32)
Calcium: 9.8 mg/dL (ref 8.4–10.5)
Chloride: 104 mEq/L (ref 96–112)
Creatinine, Ser: 0.67 mg/dL (ref 0.50–1.35)
Glucose, Bld: 103 mg/dL — ABNORMAL HIGH (ref 70–99)
Potassium: 4 mEq/L (ref 3.7–5.3)
SODIUM: 142 meq/L (ref 137–147)

## 2013-07-14 MED ORDER — LEVOFLOXACIN 500 MG PO TABS: 500.0000 mg | ORAL_TABLET | Freq: Every day | ORAL | Status: DC

## 2013-07-14 MED ORDER — PREDNISONE 10 MG PO TABS: ORAL_TABLET | ORAL | Status: DC

## 2013-07-14 MED ORDER — ALBUTEROL SULFATE HFA 108 (90 BASE) MCG/ACT IN AERS
2.0000 | INHALATION_SPRAY | Freq: Four times a day (QID) | RESPIRATORY_TRACT | Status: DC
  Filled 2013-07-14: qty 6.7

## 2013-07-14 NOTE — Discharge Summary (Signed)
Physician Discharge Summary  Raymond Neal ZOX:[id number] DOB: 12/31/1990 DOA: 07/13/2013  PCP: Erlinda Hong, MD  Admit date: 07/13/2013 Discharge date: 07/14/2013  Time spent: 60 minutes  Recommendations for Outpatient Follow-up:  1. Follow up for asthma exacerbation  Discharge Diagnoses:  Active Problems:   Acute asthma exacerbation   Tachycardia   Acute bronchitis   Discharge Condition: Stable  Diet recommendation: General  Filed Weights   07/13/13 1048  Weight: 59.285 kg (130 lb 11.2 oz)    History of present illness:  23 year old male with a history of asthma presents with one to 2 day history of URI type symptoms with increasing cough and yellow sputum production. He developed increasing shortness of breath which prompted him to come to the emergency department. He also had 2 episodes of nausea and vomiting on the day prior to admission. He has not had any diarrhea, abdominal pain, hematemesis, hematochezia, melena. He denied any fevers, chills, hemoptysis abdominal pain, dysuria, hematuria, headache, syncope, dizziness. He has not been admitted to the hospital for asthma exacerbation oriented in the past. He has not had any nighttime symptoms, and usually does not need to use his rescue inhaler on today. The patient currently is trying to quit smoking. He still smokes one cigarette per day. He previously smoked one pack per day  In ED, the patient was noted to have diffuse wheezing. The patient was given a one-hour nebulizer with improvement of his breathing, but the patient continued to have significant wheezing. As a result admission was requested. He is not in any respiratory distress at the time of my examination, and there is use of accessory muscles of respiration. Chest x-ray was negative for infiltrates. BMP and CBC results are pending at the time of my dictation.   Hospital Course:   Acute asthma exacerbation  Pt presented with wheezing, cough, shortness of breath and  x2 days of URI with Asthma Hx, reports URI trigger his asthma exacerbation Given albuterol in the ED and continued during his stay Solu-Medrol 60 mg IV administered Pt feeling better normal respiratory effort Continue Albuterol MDI PRN and Prednisone taper pack at home  Acute Bronchitis Cough, yellow sputum production with Asthma exacerbation, recent URI Began on Levaquin PO, continue with 5 day course at home Utilize OTC products such as Mucinex for congestion   Tachycardia  Likely related to the patient's nebulizer use, anxiety and asthma exacerbation  Urine drug screen positive for THC 1 L normal saline  Results of BMP and CBC normal except for WBC 11.7, attributable to Acute Bronchitis  Tobacco abuse  Pt reported significantly decreasing his daily use Tobacco cessation discussed   Substance Abuse Openly admitted cannabis use, Urine drug screen Pos for THC   Procedures:  None  Consultations:  None  Discharge Exam: Filed Vitals:   07/14/13 1144  BP:   Pulse: 91  Temp:   Resp: 18    General: WD/WN male,CA&O x 3, NAD, sitting up comfortable in bed, cooperative Cardiovascular: RRR, no murmer, rubs or gallops Respiratory: normal rate and effort, no retractions or accessory muscle use, minor wheezing noted in left lobes  Discharge Instructions You were cared for by a hospitalist during your hospital stay. If you have any questions about your discharge medications or the care you received while you were in the hospital after you are discharged, you can call the unit and asked to speak with the hospitalist on call if the hospitalist that took care of you is not available. Once you  are discharged, your primary care physician will handle any further medical issues. Please note that NO REFILLS for any discharge medications will be authorized once you are discharged, as it is imperative that you return to your primary care physician (or establish a relationship with a primary  care physician if you do not have one) for your aftercare needs so that they can reassess your need for medications and monitor your lab values.      Discharge Orders   Future Orders Complete By Expires   Diet general  As directed    Increase activity slowly  As directed        Medication List         albuterol 108 (90 BASE) MCG/ACT inhaler  Commonly known as:  PROVENTIL HFA;VENTOLIN HFA  Inhale 2 puffs into the lungs every 8 (eight) hours as needed for shortness of breath. For shortness of breath.     levofloxacin 500 MG tablet  Commonly known as:  LEVAQUIN  Take 1 tablet (500 mg total) by mouth daily.     predniSONE 10 MG tablet  Commonly known as:  DELTASONE  Take 6-5-4-3-2-1 tablet PO daily till gone.       No Known Allergies Follow-up Information   Follow up with [facility], [place]. Schedule an appointment as soon as possible for a visit in 1 week. (doctor office did not answer so be sure to make your appointment )    Specialty:  General Practice   Contact information:   [address] [place] [address] [phone]        The results of significant diagnostics from this hospitalization (including imaging, microbiology, ancillary and laboratory) are listed below for reference.    Significant Diagnostic Studies: Dg Chest 2 View  07/13/2013   CLINICAL DATA:  Cough and congestion.  EXAM: CHEST  2 VIEW  COMPARISON:  11/08/2012  FINDINGS: Lungs are hyperexpanded. There is no consolidation or effusion. Cardiomediastinal silhouette and remainder the exam is unchanged.  IMPRESSION: No active cardiopulmonary disease.   Electronically Signed   By: Elberta Fortis M.D.   On: 07/13/2013 02:58    Microbiology: No results found for this or any previous visit (from the past 240 hour(s)).   Labs: Basic Metabolic Panel:  Recent Labs Lab 07/13/13 0745 07/14/13 0701  NA 144 142  K 3.2* 4.0  CL 102 104  CO2 23 21  GLUCOSE 124* 103*  BUN 8 9  CREATININE 0.69  0.67  CALCIUM 9.5 9.8   Liver Function Tests: No results found for this basename: AST, ALT, ALKPHOS, BILITOT, PROT, ALBUMIN,  in the last 168 hours No results found for this basename: LIPASE, AMYLASE,  in the last 168 hours No results found for this basename: AMMONIA,  in the last 168 hours CBC:  Recent Labs Lab 07/13/13 0745  WBC 11.7*  HGB 15.8  HCT 43.0  MCV 91.3  PLT 200   Cardiac Enzymes: No results found for this basename: CKTOTAL, CKMB, CKMBINDEX, TROPONINI,  in the last 168 hours BNP: BNP (last 3 results) No results found for this basename: PROBNP,  in the last 8760 hours CBG: No results found for this basename: GLUCAP,  in the last 168 hours     Signed:  Clydia Llano PA-S  [facility] 07/14/2013, 12:46 PM    Addendum  Patient seen and examined, chart and data base reviewed.  I agree with the above assessment and plan.  For full details please see Mrs. Irving Burton  A Johnson PA-S note.   Clint LippsMutaz A Allona Gondek, MD [facility] Pager: [phone] 07/14/2013, 12:46 PM

## 2013-07-14 NOTE — Progress Notes (Signed)
Patient was discharged home by MD order; discharged instructions review and give to patient with care notes and prescriptions; IV DIC; skin intact; patient will be escorted to the car by nurse tech via wheelchair.  

## 2013-07-14 NOTE — Progress Notes (Signed)
Utilization review completed.  

## 2015-11-07 ENCOUNTER — Emergency Department (HOSPITAL_COMMUNITY)
Admission: EM | Admit: 2015-11-07 | Discharge: 2015-11-07 | Disposition: A | Discharge status: Home / Self Care | Payer: Self-pay | Attending: Emergency Medicine | Admitting: Emergency Medicine

## 2015-11-07 ENCOUNTER — Encounter (HOSPITAL_COMMUNITY): Payer: Self-pay | Admitting: Emergency Medicine

## 2015-11-07 DIAGNOSIS — Z760 Encounter for issue of repeat prescription: Secondary | ICD-10-CM | POA: Insufficient documentation

## 2015-11-07 DIAGNOSIS — F17210 Nicotine dependence, cigarettes, uncomplicated: Secondary | ICD-10-CM | POA: Insufficient documentation

## 2015-11-07 DIAGNOSIS — Z79899 Other long term (current) drug therapy: Secondary | ICD-10-CM | POA: Insufficient documentation

## 2015-11-07 DIAGNOSIS — J45909 Unspecified asthma, uncomplicated: Secondary | ICD-10-CM | POA: Insufficient documentation

## 2015-11-07 MED ORDER — ALBUTEROL SULFATE HFA 108 (90 BASE) MCG/ACT IN AERS
1.0000 | INHALATION_SPRAY | Freq: Once | RESPIRATORY_TRACT | Status: AC
  Administered 2015-11-07: 1 via RESPIRATORY_TRACT
  Filled 2015-11-07: qty 6.7

## 2015-11-07 MED ORDER — ALBUTEROL SULFATE HFA 108 (90 BASE) MCG/ACT IN AERS: 2.0000 | INHALATION_SPRAY | RESPIRATORY_TRACT | 3 refills | Status: DC | PRN

## 2015-11-07 NOTE — ED Provider Notes (Signed)
By signing my name below, I, Vista Mink, attest that this documentation has been prepared under the direction and in the presence of Kristen N Ward, DO. Electronically signed, Vista Mink, ED Scribe. 11/07/15. 3:21 AM.  TIME SEEN: 3:17AM  CHIEF COMPLAINT: Medication refill  HPI:  HPI Comments: Raymond Neal is a 25 y.o. male with a PMHx of asthma, who presents to the Emergency Department after losing his inhaler earlier today. Pt reports that he lost his inhaler at work today and wanted to come in to get a new one just in case. Pt denies any recent or current symptoms of his asthma. Pt denies chest pain, fever, shortness of breath, cough. Pt denies any current PCP.  ROS: See HPI Constitutional: no fever  Eyes: no drainage  ENT: no runny nose   Cardiovascular:  no chest pain  Resp: no SOB  GI: no vomiting GU: no dysuria Integumentary: no rash  Allergy: no hives  Musculoskeletal: no leg swelling  Neurological: no slurred speech ROS otherwise negative  PAST MEDICAL HISTORY/PAST SURGICAL HISTORY:  Past Medical History:  Diagnosis Date  . Asthma     MEDICATIONS:  Prior to Admission medications   Medication Sig Start Date End Date Taking? Authorizing Provider  albuterol (PROVENTIL HFA;VENTOLIN HFA) 108 (90 BASE) MCG/ACT inhaler Inhale 2 puffs into the lungs every 8 (eight) hours as needed for shortness of breath. For shortness of breath.   Yes Historical Provider, MD  levofloxacin (LEVAQUIN) 500 MG tablet Take 1 tablet (500 mg total) by mouth daily. Patient not taking: Reported on 11/07/2015 07/14/13   Clydia Llano, MD  predniSONE (DELTASONE) 10 MG tablet Take 6-5-4-3-2-1 tablet PO daily till gone. Patient not taking: Reported on 11/07/2015 07/14/13   Clydia Llano, MD    ALLERGIES:  No Known Allergies  SOCIAL HISTORY:  Social History  Substance Use Topics  . Smoking status: Current Every Day Smoker    Packs/day: 0.10    Years: 5.00    Types: Cigarettes  . Smokeless tobacco: Never  Used  . Alcohol use No    FAMILY HISTORY: No family history on file.  EXAM: BP 154/92 (BP Location: Right Arm)   Pulse 98   Temp 98.4 F (36.9 C) (Oral)   Resp 16   Ht 5\' 7"  (1.702 m)   Wt 138 lb (62.6 kg)   SpO2 100%   BMI 21.61 kg/m  CONSTITUTIONAL: Alert and oriented and responds appropriately to questions. Well-appearing; well-nourished HEAD: Normocephalic EYES: Conjunctivae clear, PERRL ENT: normal nose; no rhinorrhea; moist mucous membranes NECK: Supple, no meningismus, no LAD  CARD: RRR; S1 and S2 appreciated; no murmurs, no clicks, no rubs, no gallops RESP: Normal chest excursion without splinting or tachypnea; breath sounds clear and equal bilaterally; no wheezes, no rhonchi, no rales, no hypoxia or respiratory distress, speaking full sentences ABD/GI: Normal bowel sounds; non-distended; soft, non-tender, no rebound, no guarding, no peritoneal signs BACK:  The back appears normal and is non-tender to palpation, there is no CVA tenderness EXT: Normal ROM in all joints; non-tender to palpation; no edema; normal capillary refill; no cyanosis, no calf tenderness or swelling    SKIN: Normal color for age and race; warm; no rash NEURO: Moves all extremities equally, sensation to light touch intact diffusely, cranial nerves II through XII intact PSYCH: The patient's mood and manner are appropriate. Grooming and personal hygiene are appropriate.  MEDICAL DECISION MAKING: Patient here requesting a refill for his albuterol. States he lost his inhaler and has  a history of asthma. Currently has no symptoms. He is requesting that we give him an albuterol inhaler in the emergency department and discharge with a refill. We'll also give him outpatient PCP follow-up information. Discussed with him return precautions.   At this time, I do not feel there is any life-threatening condition present. I have reviewed and discussed all results (EKG, imaging, lab, urine as appropriate), exam  findings with patient/family. I have reviewed nursing notes and appropriate previous records.  I feel the patient is safe to be discharged home without further emergent workup and can continue workup as an outpatient. Discussed usual and customary return precautions. Patient/family verbalize understanding and are comfortable with this plan.  Outpatient follow-up has been provided. All questions have been answered.   This chart was scribed in my presence and reviewed by me personally.    Layla Maw Ward, DO 11/07/15 [phone]

## 2015-11-07 NOTE — Discharge Instructions (Signed)
To find a primary care or specialty doctor please call [phone] or [phone] to access "[facility] Find a Doctor Service." ° °You may also go on the [facility] website at [email or web address] ° °There are also multiple [facility] and [facility] practices throughout the [place] that are frequently accepting new patients. You may find a clinic that is close to your home and contact them. ° °[facility] -  °[address] °[place] [address] °[phone] ° °[facility] in [place] (also locations in [place] and [place]) -  °[address] °[place] [address] [phone] ° °[facility] -  °[address] °[place] [address] [phone] ° ° °

## 2015-11-07 NOTE — ED Triage Notes (Signed)
Pt. lost his inhaler for his asthma today , requesting prescription or an inhaler to take home , no SOB or wheezing at arrival .

## 2015-11-07 NOTE — ED Notes (Signed)
Pt denies SOB or discomfort at this time.

## 2016-03-17 ENCOUNTER — Encounter (HOSPITAL_COMMUNITY): Payer: Self-pay | Admitting: *Deleted

## 2016-03-17 ENCOUNTER — Emergency Department (HOSPITAL_COMMUNITY)
Admission: EM | Admit: 2016-03-17 | Discharge: 2016-03-17 | Disposition: A | Discharge status: Home / Self Care | Payer: Self-pay | Attending: Emergency Medicine | Admitting: Emergency Medicine

## 2016-03-17 DIAGNOSIS — F17210 Nicotine dependence, cigarettes, uncomplicated: Secondary | ICD-10-CM | POA: Insufficient documentation

## 2016-03-17 DIAGNOSIS — Z760 Encounter for issue of repeat prescription: Secondary | ICD-10-CM | POA: Insufficient documentation

## 2016-03-17 DIAGNOSIS — J45909 Unspecified asthma, uncomplicated: Secondary | ICD-10-CM | POA: Insufficient documentation

## 2016-03-17 MED ORDER — ALBUTEROL SULFATE HFA 108 (90 BASE) MCG/ACT IN AERS
2.0000 | INHALATION_SPRAY | RESPIRATORY_TRACT | Status: DC | PRN
  Administered 2016-03-17: 2 via RESPIRATORY_TRACT
  Filled 2016-03-17: qty 6.7

## 2016-03-17 NOTE — ED Triage Notes (Signed)
Pt reports having asthma and is requesting a refill for his inhalers. No resp distress noted at triage.

## 2016-03-17 NOTE — ED Provider Notes (Signed)
[facility]PT Provider Note   CSN: [id number] Arrival date & time: 03/17/16  1620  By signing my name below, I, Doreatha MartinEva Mathews, attest that this documentation has been prepared under the direction and in the presence of  [facility]. Damian LeavellNeese, NP. Electronically Signed: Doreatha MartinEva Mathews, ED Scribe. 03/17/16. 5:18 PM.     History   Chief Complaint Chief Complaint  Patient presents with  . Asthma  . Medication Refill    HPI Raymond GreenspanJohn W Neal is a 25 y.o. male with h/o asthma who presents to the Emergency Department requesting refill of ProAir inhaler, which he ran out of this week. Pt states he has previously been prescribed this medication. Pt states he does not currently have a follow up appointment with his PCP. He denies any wheezing, cough, SOB, abdominal pain, additional complaints.    The history is provided by the patient. No language interpreter was used.  Medication Refill  Medications/supplies requested:  Proair inhaler Reason for request:  Medications ran out Medications taken before: yes - see home medications   Patient has complete original prescription information: no   Source of information:  Patient reported - no other verification available   Past Medical History:  Diagnosis Date  . Asthma     Patient Active Problem List   Diagnosis Date Noted  . Acute bronchitis 07/14/2013  . Acute asthma exacerbation 07/13/2013  . Tachycardia 07/13/2013  . Asthma 02/10/2011    Past Surgical History:  Procedure Laterality Date  . CLAVICLE SURGERY Right 2014       Home Medications    Prior to Admission medications   Medication Sig Start Date End Date Taking? Authorizing Provider  albuterol (PROVENTIL HFA;VENTOLIN HFA) 108 (90 Base) MCG/ACT inhaler Inhale 2 puffs into the lungs every 4 (four) hours as needed for wheezing or shortness of breath. 11/07/15   Layla MawKristen N Ward, DO    Family History History reviewed. No pertinent family history.  Social History Social History    Substance Use Topics  . Smoking status: Current Every Day Smoker    Packs/day: 0.10    Years: 5.00    Types: Cigarettes  . Smokeless tobacco: Never Used  . Alcohol use No     Allergies   Patient has no known allergies.   Review of Systems Review of Systems  Respiratory: Negative for cough, shortness of breath and wheezing.   Gastrointestinal: Negative for abdominal pain.  All other systems reviewed and are negative.    Physical Exam Updated Vital Signs BP 122/73 (BP Location: Right Arm)   Pulse 94   Temp 97.7 F (36.5 C) (Oral)   Resp 12   Ht 5\' 8"  (1.727 m)   Wt 72.3 kg   SpO2 100%   BMI 24.22 kg/m   Physical Exam  Constitutional: He appears well-developed and well-nourished.  HENT:  Head: Normocephalic.  Eyes: Conjunctivae are normal.  Cardiovascular: Normal rate and regular rhythm.   Pulmonary/Chest: Effort normal. No respiratory distress. He has wheezes. He has no rales.  Occasional inspiratory wheezes  Abdominal: Soft. He exhibits no distension. There is no tenderness.  Musculoskeletal: Normal range of motion.  Neurological: He is alert.  Skin: Skin is warm and dry.  Psychiatric: He has a normal mood and affect. His behavior is normal.  Nursing note and vitals reviewed.    ED Treatments / Results   DIAGNOSTIC STUDIES: Oxygen Saturation is 100% on RA, normal by my interpretation.    COORDINATION OF CARE: 5:15 PM Discussed treatment  plan with pt at bedside which includes medication refill and pt agreed to plan.    Labs (all labs ordered are listed, but only abnormal results are displayed) Labs Reviewed - No data to display Radiology No results found.  Procedures Procedures (including critical care time)  Medications Ordered in ED Medications  albuterol (PROVENTIL HFA;VENTOLIN HFA) 108 (90 Base) MCG/ACT inhaler 2 puff (not administered)     Initial Impression / Assessment and Plan / ED Course  I have reviewed the triage vital signs and  the nursing notes.  Pertinent labs & imaging results that were available during my care of the patient were reviewed by me and considered in my medical decision making (see chart for details).  Clinical Course     Pt here for refill of medication. Medication is not a controlled substance. Will refill medication here. Discussed need to follow up with PCP in 2-3 days.  Pt is safe for discharge at this time.   Final Clinical Impressions(s) / ED Diagnoses   Final diagnoses:  Medication refill    New Prescriptions New Prescriptions   No medications on file   I personally performed the services described in this documentation, which was scribed in my presence. The recorded information has been reviewed and is accurate.    [address]Hope HazlehurstM Shaila Gilchrest, NP 03/17/16 1721    Nelva Nayobert Beaton, MD 03/20/16 [phone]

## 2016-03-17 NOTE — ED Notes (Signed)
Declined W/C at D/C and was escorted to lobby by RN. 

## 2016-07-18 ENCOUNTER — Encounter (HOSPITAL_COMMUNITY): Payer: Self-pay

## 2016-07-18 ENCOUNTER — Emergency Department (HOSPITAL_COMMUNITY)
Admission: EM | Admit: 2016-07-18 | Discharge: 2016-07-18 | Disposition: A | Discharge status: Home / Self Care | Payer: Self-pay | Attending: Emergency Medicine | Admitting: Emergency Medicine

## 2016-07-18 DIAGNOSIS — J45909 Unspecified asthma, uncomplicated: Secondary | ICD-10-CM | POA: Insufficient documentation

## 2016-07-18 DIAGNOSIS — Z760 Encounter for issue of repeat prescription: Secondary | ICD-10-CM | POA: Insufficient documentation

## 2016-07-18 DIAGNOSIS — Z87891 Personal history of nicotine dependence: Secondary | ICD-10-CM | POA: Insufficient documentation

## 2016-07-18 MED ORDER — ALBUTEROL SULFATE HFA 108 (90 BASE) MCG/ACT IN AERS
2.0000 | INHALATION_SPRAY | RESPIRATORY_TRACT | Status: DC | PRN
  Administered 2016-07-18: 2 via RESPIRATORY_TRACT
  Filled 2016-07-18: qty 6.7

## 2016-07-18 NOTE — ED Notes (Signed)
Pt requesting prescription for albuterol inhaler. Currently not feeling like he needs to use it. See providers assessment.

## 2016-07-18 NOTE — ED Triage Notes (Signed)
Pt endorses being out of his albuterol inhaler x 1 month and wants a prescription. Pt has no complaints. VSS.

## 2016-07-18 NOTE — ED Provider Notes (Signed)
[facility] Provider Note   CSN: [id number] Arrival date & time: 07/18/16  2000   By signing my name below, I, Clarisse Gouge, attest that this documentation has been prepared under the direction and in the presence of [facility], FNP. Electronically Signed: Clarisse Gouge, Scribe. 07/18/16. 9:59 PM.   History   Chief Complaint Chief Complaint  Patient presents with  . Medication Refill   The history is provided by the patient and medical records. No language interpreter was used.    Raymond Neal is a 26 y.o. male with h/o former tobacco use, asthma, tachycardia and bronchitis, self transported via private vehicle to the Emergency Department asymptomatically with concern for exhausted albuterol x 1 month. He notes no nebulizer treatment at home. He adds he last used it "a couple weeks ago", last felt like he might need to use it " a couple days ago", and he reports today because of concern for possible anticipated exacerbations by fluctuating factors. Pt denies wheezing, cough, SOB, sore throat, congestion or any other complaints at this time.  Past Medical History:  Diagnosis Date  . Asthma     Patient Active Problem List   Diagnosis Date Noted  . Acute bronchitis 07/14/2013  . Acute asthma exacerbation 07/13/2013  . Tachycardia 07/13/2013  . Asthma 02/10/2011    Past Surgical History:  Procedure Laterality Date  . CLAVICLE SURGERY Right 2014       Home Medications    Prior to Admission medications   Medication Sig Start Date End Date Taking? Authorizing Provider  albuterol (PROVENTIL HFA;VENTOLIN HFA) 108 (90 Base) MCG/ACT inhaler Inhale 2 puffs into the lungs every 4 (four) hours as needed for wheezing or shortness of breath. 11/07/15   Layla Maw Ward, DO    Family History History reviewed. No pertinent family history.  Social History Social History  Substance Use Topics  . Smoking status: Former Smoker    Packs/day: 0.00    Years: 5.00  . Smokeless  tobacco: Never Used  . Alcohol use No     Allergies   Patient has no known allergies.   Review of Systems Review of Systems  HENT: Negative for congestion and sore throat.   Respiratory: Negative for cough, shortness of breath and wheezing.   Cardiovascular: Negative for chest pain.     Physical Exam Updated Vital Signs BP (!) 143/90 (BP Location: Left Arm)   Pulse (!) 120   Temp 98.8 F (37.1 C) (Oral)   Resp (!) 22   Ht  (1.676 m)   Wt 150 lb (68 kg)   SpO2 100%   BMI 24.21 kg/m   Physical Exam  Constitutional: He appears well-developed and well-nourished. No distress.  HENT:  Head: Normocephalic and atraumatic.  Mouth/Throat: Uvula is midline and oropharynx is clear and moist. No oropharyngeal exudate.  Eyes: Pupils are equal, round, and reactive to light.  Neck: Neck supple. No tracheal deviation present.  Cardiovascular: Regular rhythm.  Tachycardia present.   Pulmonary/Chest: Effort normal and breath sounds normal. No stridor. He has no wheezes. He has no rales.  Musculoskeletal: Normal range of motion.  Neurological: He is alert.  Skin: Skin is warm and dry.  Psychiatric: He has a normal mood and affect. His behavior is normal.  Nursing note and vitals reviewed.    ED Treatments / Results  DIAGNOSTIC STUDIES: Oxygen Saturation is 100% on RA, normal by my interpretation.    COORDINATION OF CARE: 9:19 PM Discussed treatment plan  with pt at bedside and pt agreed to plan. Will give albuterol inhaler prior to d/c.  Recheck of VS and HR down to 104  Labs (all labs ordered are listed, but only abnormal results are displayed) Labs Reviewed - No data to display Radiology No results found.  Procedures Procedures (including critical care time)  Medications Ordered in ED Medications  albuterol (PROVENTIL HFA;VENTOLIN HFA) 108 (90 Base) MCG/ACT inhaler 2 puff (2 puffs Inhalation Given 07/18/16 2137)     Initial Impression / Assessment and Plan / ED  Course  I have reviewed the triage vital signs and the nursing notes. Final Clinical Impressions(s) / ED Diagnoses  26 y.o. male with request for Albuterol inhaler stable for d/c without symptoms at this time. Discussed medication refill in the ED. Patient to f/u with his PCP.  Final diagnoses:  Medication refill    New Prescriptions New Prescriptions   No medications on file  I personally performed the services described in this documentation, which was scribed in my presence. The recorded information has been reviewed and is accurate.    Ethel, NP 07/18/16 2200    Arby Barrette, MD 07/22/16 1051

## 2016-07-18 NOTE — ED Notes (Signed)
Pt left before d/c complete. Np made aware. Pt was in NAD when last seen.

## 2017-01-14 ENCOUNTER — Emergency Department (HOSPITAL_COMMUNITY)
Admission: EM | Admit: 2017-01-14 | Discharge: 2017-01-14 | Disposition: A | Discharge status: Home / Self Care | Payer: Self-pay | Attending: Emergency Medicine | Admitting: Emergency Medicine

## 2017-01-14 ENCOUNTER — Encounter (HOSPITAL_COMMUNITY): Payer: Self-pay | Admitting: Emergency Medicine

## 2017-01-14 DIAGNOSIS — Z760 Encounter for issue of repeat prescription: Secondary | ICD-10-CM | POA: Insufficient documentation

## 2017-01-14 DIAGNOSIS — Z87891 Personal history of nicotine dependence: Secondary | ICD-10-CM | POA: Insufficient documentation

## 2017-01-14 DIAGNOSIS — J45909 Unspecified asthma, uncomplicated: Secondary | ICD-10-CM | POA: Insufficient documentation

## 2017-01-14 MED ORDER — ALBUTEROL SULFATE HFA 108 (90 BASE) MCG/ACT IN AERS: 2.0000 | INHALATION_SPRAY | RESPIRATORY_TRACT | 1 refills | Status: DC | PRN

## 2017-01-14 MED ORDER — ALBUTEROL SULFATE HFA 108 (90 BASE) MCG/ACT IN AERS
2.0000 | INHALATION_SPRAY | Freq: Once | RESPIRATORY_TRACT | Status: AC
  Administered 2017-01-14: 2 via RESPIRATORY_TRACT
  Filled 2017-01-14: qty 6.7

## 2017-01-14 NOTE — ED Notes (Signed)
See EDP secondary assessment.  

## 2017-01-14 NOTE — ED Notes (Signed)
When RN went to discharge pt, gave pt inhaler for home use. Pt walked out of room. RN stated, "I have your discharge paperwork to go over." Pt states, "You can keep it and left without discharge papers, discharge vitals or e-signature."

## 2017-01-14 NOTE — ED Provider Notes (Signed)
[facility] Provider Note   CSN: [id number] Arrival date & time: 01/14/17  1845     History   Chief Complaint Chief Complaint  Patient presents with  . Asthma    HPI Raymond Neal is a 27 y.o. male.  HPI   Raymond Neal is a 26 y.o. male, with a history of Asthma, presenting to the ED for refill on his albuterol inhaler. Patient was not very forthcoming with information. Gives one word answers. When asked what brings him in, patient states, "Asthma." When I asked how I can help, he states, "I just need an inhaler. I have been waiting here for hours with people passing me by." I was able to determine that the patient has not had any symptoms of asthma exacerbation, shortness of breath, chest pain, cough, or worsening asthma symptoms "since childhood."      Past Medical History:  Diagnosis Date  . Asthma     Patient Active Problem List   Diagnosis Date Noted  . Acute bronchitis 07/14/2013  . Acute asthma exacerbation 07/13/2013  . Tachycardia 07/13/2013  . Asthma 02/10/2011    Past Surgical History:  Procedure Laterality Date  . CLAVICLE SURGERY Right 2014       Home Medications    Prior to Admission medications   Medication Sig Start Date End Date Taking? Authorizing Provider  albuterol (PROVENTIL HFA;VENTOLIN HFA) 108 (90 Base) MCG/ACT inhaler Inhale 2 puffs into the lungs every 4 (four) hours as needed for wheezing or shortness of breath. 11/07/15   Ward, Layla Maw, DO  albuterol (PROVENTIL HFA;VENTOLIN HFA) 108 (90 Base) MCG/ACT inhaler Inhale 2 puffs into the lungs every 4 (four) hours as needed for wheezing or shortness of breath. 01/14/17   Nyajah Hyson, Hillard Danker, PA-C    Family History No family history on file.  Social History Social History  Substance Use Topics  . Smoking status: Former Smoker    Packs/day: 0.00    Years: 5.00  . Smokeless tobacco: Never Used  . Alcohol use No     Allergies   Patient has no known allergies.   Review of  Systems Review of Systems  Respiratory: Negative for shortness of breath.   Cardiovascular: Negative for chest pain.     Physical Exam Updated Vital Signs BP 117/85 (BP Location: Left Arm)   Pulse (!) 111   Temp 97.8 F (36.6 C) (Oral)   Resp 18   Ht  (1.753 m)   Wt 68 kg (150 lb)   SpO2 97%   BMI 22.15 kg/m   Physical Exam  Constitutional: He appears well-developed and well-nourished. No distress.  Patient would not allow a full exam.  HENT:  Head: Normocephalic and atraumatic.  Eyes: Conjunctivae are normal.  Neck: Neck supple.  Cardiovascular: Normal rate and regular rhythm.   Pulmonary/Chest: Effort normal.  Patient shows no increased work of breathing. Speaks without noted difficulty.  Neurological: He is alert.  Skin: Skin is warm and dry. He is not diaphoretic. No pallor.  Psychiatric: He has a normal mood and affect. His behavior is normal.  Nursing note and vitals reviewed.    ED Treatments / Results  Labs (all labs ordered are listed, but only abnormal results are displayed) Labs Reviewed - No data to display  EKG  EKG Interpretation None       Radiology No results found.  Procedures Procedures (including critical care time)  Medications Ordered in ED Medications  albuterol (PROVENTIL HFA;VENTOLIN HFA) 108 (  90 Base) MCG/ACT inhaler 2 puff (2 puffs Inhalation Given 01/14/17 2059)     Initial Impression / Assessment and Plan / ED Course  I have reviewed the triage vital signs and the nursing notes.  Pertinent labs & imaging results that were available during my care of the patient were reviewed by me and considered in my medical decision making (see chart for details).     Patient presents for refill of his asthma albuterol inhaler. Patient would not answer most of my questions. He would not allow a more thorough exam. I spoke with the patient about the inappropriateness of using the emergency department for medication refills. I advised  him that it is important that he have a primary care provider to manage his asthma and discussed reasons why. Patient rolled his eyes and stated, "Can I just get my inhaler now." Patient was granted his request because I did not want patient to be without possibly lifesaving medication.     Final Clinical Impressions(s) / ED Diagnoses   Final diagnoses:  Encounter for medication refill    New Prescriptions Discharge Medication List as of 01/14/2017  8:56 PM    START taking these medications   Details  !! albuterol (PROVENTIL HFA;VENTOLIN HFA) 108 (90 Base) MCG/ACT inhaler Inhale 2 puffs into the lungs every 4 (four) hours as needed for wheezing or shortness of breath., Starting Tue 01/14/2017, Print     !! - Potential duplicate medications found. Please discuss with provider.       Concepcion Living 01/14/17 2111    Benjiman Core, MD 01/14/17 [phone]

## 2017-01-14 NOTE — Discharge Instructions (Signed)
Please secure care with a primary care provider. This is necessary to periodically review your asthma and overall health. The emergency department is not the appropriate place for medication refills.

## 2017-01-14 NOTE — ED Triage Notes (Signed)
Pt st's he has a hx of asthma and is out of his inhaler.  Pt st's he need another inhaler and Rx for same.  No resp problems present at this time

## 2017-07-19 ENCOUNTER — Emergency Department (HOSPITAL_COMMUNITY)
Admission: EM | Admit: 2017-07-19 | Discharge: 2017-07-19 | Disposition: A | Discharge status: Home / Self Care | Payer: Self-pay | Attending: Emergency Medicine | Admitting: Emergency Medicine

## 2017-07-19 ENCOUNTER — Other Ambulatory Visit: Payer: Self-pay

## 2017-07-19 ENCOUNTER — Encounter (HOSPITAL_COMMUNITY): Payer: Self-pay | Admitting: Emergency Medicine

## 2017-07-19 DIAGNOSIS — Z760 Encounter for issue of repeat prescription: Secondary | ICD-10-CM

## 2017-07-19 DIAGNOSIS — J4520 Mild intermittent asthma, uncomplicated: Secondary | ICD-10-CM | POA: Insufficient documentation

## 2017-07-19 DIAGNOSIS — Z87891 Personal history of nicotine dependence: Secondary | ICD-10-CM | POA: Insufficient documentation

## 2017-07-19 MED ORDER — ALBUTEROL SULFATE HFA 108 (90 BASE) MCG/ACT IN AERS
2.0000 | INHALATION_SPRAY | RESPIRATORY_TRACT | Status: DC | PRN
  Administered 2017-07-19: 2 via RESPIRATORY_TRACT
  Filled 2017-07-19: qty 6.7

## 2017-07-19 MED ORDER — AEROCHAMBER PLUS FLO-VU LARGE MISC
Status: AC
  Filled 2017-07-19: qty 1

## 2017-07-19 MED ORDER — AEROCHAMBER PLUS FLO-VU MEDIUM MISC
1.0000 | Freq: Once | Status: AC
  Administered 2017-07-19: 1
  Filled 2017-07-19: qty 1

## 2017-07-19 MED ORDER — ALBUTEROL SULFATE HFA 108 (90 BASE) MCG/ACT IN AERS: 2.0000 | INHALATION_SPRAY | RESPIRATORY_TRACT | 3 refills | Status: DC | PRN

## 2017-07-19 NOTE — ED Notes (Signed)
Patient able to ambulate independently  

## 2017-07-19 NOTE — ED Provider Notes (Signed)
[facility] EMERGENCY DEPARTMENT Provider Note   CSN: [id number] Arrival date & time: 07/19/17  0941     History   Chief Complaint Chief Complaint  Patient presents with  . Medication Refill    HPI Raymond Neal is a 27 y.o. male who presents today for a refill on his albuterol.  He has a history of asthma.  He denies any shortness of breath or symptoms related to asthma at this time.  Reports he does have a primary care provider, however he works outside and needs to have the inhaler Monday so that he can work outside without suffering an asthma attack.  HPI  Past Medical History:  Diagnosis Date  . Asthma     Patient Active Problem List   Diagnosis Date Noted  . Acute bronchitis 07/14/2013  . Acute asthma exacerbation 07/13/2013  . Tachycardia 07/13/2013  . Asthma 02/10/2011    Past Surgical History:  Procedure Laterality Date  . CLAVICLE SURGERY Right 2014        Home Medications    Prior to Admission medications   Medication Sig Start Date End Date Taking? Authorizing Provider  albuterol (PROVENTIL HFA;VENTOLIN HFA) 108 (90 Base) MCG/ACT inhaler Inhale 2 puffs into the lungs every 4 (four) hours as needed for wheezing or shortness of breath. 07/19/17   Cristina Gong, PA-C    Family History No family history on file.  Social History Social History   Tobacco Use  . Smoking status: Former Smoker    Packs/day: 0.00    Years: 5.00    Pack years: 0.00  . Smokeless tobacco: Never Used  Substance Use Topics  . Alcohol use: No  . Drug use: Yes    Frequency: 1.0 times per week    Types: Marijuana     Allergies   Patient has no known allergies.   Review of Systems Review of Systems  Constitutional: Negative for chills and fever.  Respiratory: Negative for cough, chest tightness, shortness of breath and wheezing.      Physical Exam Updated Vital Signs BP 110/81 (BP Location: Right Arm)   Pulse 86   Temp 98.1 F (36.7  C) (Oral)   Resp (!) 22   Ht 5\' 10"  (1.778 m)   Wt 68 kg (150 lb)   SpO2 99%   BMI 21.52 kg/m   Physical Exam  Constitutional: He appears well-developed and well-nourished.  HENT:  Head: Normocephalic and atraumatic.  Cardiovascular: Normal rate, regular rhythm and normal heart sounds.  Pulmonary/Chest: Effort normal and breath sounds normal. No stridor. No respiratory distress. He has no wheezes. He has no rales.  Neurological: He is alert.  Skin: Skin is warm and dry. He is not diaphoretic.  Psychiatric: He has a normal mood and affect.  Nursing note and vitals reviewed.    ED Treatments / Results  Labs (all labs ordered are listed, but only abnormal results are displayed) Labs Reviewed - No data to display  EKG None  Radiology No results found.  Procedures Procedures (including critical care time)  Medications Ordered in ED Medications  albuterol (PROVENTIL HFA;VENTOLIN HFA) 108 (90 Base) MCG/ACT inhaler 2 puff (has no administration in time range)  AEROCHAMBER PLUS FLO-VU MEDIUM MISC 1 each (has no administration in time range)     Initial Impression / Assessment and Plan / ED Course  I have reviewed the triage vital signs and the nursing notes.  Pertinent labs & imaging results that were available during my  care of the patient were reviewed by me and considered in my medical decision making (see chart for details).    Patient presents today for evaluation of a refill on his albuterol.  He denies shortness of breath or any asthma related symptoms at this time.  He was given an inhaler and a spacer while in the department.  He was also given a prescription for albuterol inhaler.  It was recommended that he start taking a daily second-generation antihistamine, along with a nasal steroid spray as he also has seasonal allergies and eczema.  He is not short of breath, is not currently having an asthma flare therefore he was not given steroids.  Importance of following  up with his primary care provider for future refills was discussed with patient.  Return precautions discussed.  Discharged home.  Final Clinical Impressions(s) / ED Diagnoses   Final diagnoses:  Encounter for medication refill  Mild intermittent asthma, unspecified whether complicated    ED Discharge Orders        Ordered    albuterol (PROVENTIL HFA;VENTOLIN HFA) 108 (90 Base) MCG/ACT inhaler  Every 4 hours PRN     07/19/17 1142       Cristina GongHammond, Tashera Montalvo W, [place] JerseyPA-C 07/19/17 1148    Vanetta MuldersZackowski, Scott, MD 07/20/17 [phone]

## 2017-07-19 NOTE — ED Triage Notes (Signed)
Pt. Stated, I need a refill on my inhaler, out since thursday

## 2017-07-19 NOTE — Discharge Instructions (Addendum)
Please consider taking a daily allergy medication to help with your symptoms.  I suggest a less drowsy 24 hour medication such as allegra, zyrtec or Claritin or the generic version.  I have found that allegra tends to work best but any of the medications should help.  Please also start taking a nasal steroid spray.  These are also available over-the-counter.  The 2 common options are Flonase and Nasacort.  I recommend of the two using Nasacort, however you may use Flonase instead if it is less expensive for you.

## 2017-11-27 ENCOUNTER — Encounter (HOSPITAL_COMMUNITY): Payer: Self-pay

## 2017-11-27 ENCOUNTER — Inpatient Hospital Stay (HOSPITAL_COMMUNITY)
Admission: EM | Admit: 2017-11-27 | Discharge: 2017-11-30 | DRG: 202 | Disposition: A | Discharge status: Home / Self Care | Payer: Self-pay | Attending: Family Medicine | Admitting: Family Medicine

## 2017-11-27 DIAGNOSIS — J4551 Severe persistent asthma with (acute) exacerbation: Principal | ICD-10-CM | POA: Diagnosis present

## 2017-11-27 DIAGNOSIS — J45901 Unspecified asthma with (acute) exacerbation: Secondary | ICD-10-CM | POA: Diagnosis present

## 2017-11-27 DIAGNOSIS — E876 Hypokalemia: Secondary | ICD-10-CM | POA: Diagnosis present

## 2017-11-27 DIAGNOSIS — J9601 Acute respiratory failure with hypoxia: Secondary | ICD-10-CM | POA: Diagnosis present

## 2017-11-27 DIAGNOSIS — J40 Bronchitis, not specified as acute or chronic: Secondary | ICD-10-CM | POA: Diagnosis present

## 2017-11-27 DIAGNOSIS — F1210 Cannabis abuse, uncomplicated: Secondary | ICD-10-CM | POA: Diagnosis present

## 2017-11-27 DIAGNOSIS — F17210 Nicotine dependence, cigarettes, uncomplicated: Secondary | ICD-10-CM | POA: Diagnosis present

## 2017-11-27 DIAGNOSIS — Z720 Tobacco use: Secondary | ICD-10-CM | POA: Diagnosis present

## 2017-11-27 DIAGNOSIS — B9789 Other viral agents as the cause of diseases classified elsewhere: Secondary | ICD-10-CM | POA: Diagnosis present

## 2017-11-27 DIAGNOSIS — B9710 Unspecified enterovirus as the cause of diseases classified elsewhere: Secondary | ICD-10-CM | POA: Diagnosis present

## 2017-11-27 MED ORDER — ALBUTEROL (5 MG/ML) CONTINUOUS INHALATION SOLN
10.0000 mg/h | INHALATION_SOLUTION | Freq: Once | RESPIRATORY_TRACT | Status: AC
  Administered 2017-11-27: 10 mg/h via RESPIRATORY_TRACT
  Filled 2017-11-27: qty 20

## 2017-11-27 MED ORDER — MAGNESIUM SULFATE 2 GM/50ML IV SOLN
2.0000 g | Freq: Once | INTRAVENOUS | Status: AC
  Administered 2017-11-27: 2 g via INTRAVENOUS
  Filled 2017-11-27: qty 50

## 2017-11-27 NOTE — Progress Notes (Signed)
Pt brought in for respiratory distress r/t asthma exacerbation. [facility] gave pt Duo x2, and Albuterol x1 treatment before arrival to ED. MD ordered CAT 10mg /hr x1hr. Pt has bilat insp/exp wheezing throughout all lung fields. RT will continue to monitor.

## 2017-11-27 NOTE — ED Triage Notes (Signed)
Pt. Came by EMS from home with reports of respiratory distress. Pt. Was sweating and in tripod position when EMS arrived. Pt. Wheezing in all fields. Pt. Has hx. Of asthma. Pt. Given 15 total of albuterol 2 duonebs 4 zofran and 125 of solumedrol. Pt. A/o X4. RR 20 HR 136 BP 138/98.

## 2017-11-28 ENCOUNTER — Observation Stay (HOSPITAL_COMMUNITY): Payer: Self-pay

## 2017-11-28 ENCOUNTER — Emergency Department (HOSPITAL_COMMUNITY): Payer: Self-pay

## 2017-11-28 ENCOUNTER — Encounter (HOSPITAL_COMMUNITY): Payer: Self-pay | Admitting: Internal Medicine

## 2017-11-28 DIAGNOSIS — E876 Hypokalemia: Secondary | ICD-10-CM | POA: Diagnosis present

## 2017-11-28 DIAGNOSIS — Z720 Tobacco use: Secondary | ICD-10-CM | POA: Diagnosis present

## 2017-11-28 DIAGNOSIS — J9601 Acute respiratory failure with hypoxia: Secondary | ICD-10-CM

## 2017-11-28 DIAGNOSIS — J45901 Unspecified asthma with (acute) exacerbation: Secondary | ICD-10-CM | POA: Diagnosis present

## 2017-11-28 LAB — RESPIRATORY PANEL BY PCR
Adenovirus: NOT DETECTED
BORDETELLA PERTUSSIS-RVPCR: NOT DETECTED
CORONAVIRUS 229E-RVPPCR: NOT DETECTED
CORONAVIRUS HKU1-RVPPCR: NOT DETECTED
CORONAVIRUS OC43-RVPPCR: NOT DETECTED
Chlamydophila pneumoniae: NOT DETECTED
Coronavirus NL63: NOT DETECTED
Influenza A: NOT DETECTED
Influenza B: NOT DETECTED
METAPNEUMOVIRUS-RVPPCR: NOT DETECTED
Mycoplasma pneumoniae: NOT DETECTED
PARAINFLUENZA VIRUS 1-RVPPCR: NOT DETECTED
PARAINFLUENZA VIRUS 2-RVPPCR: NOT DETECTED
PARAINFLUENZA VIRUS 3-RVPPCR: NOT DETECTED
Parainfluenza Virus 4: NOT DETECTED
RHINOVIRUS / ENTEROVIRUS - RVPPCR: DETECTED — AB
Respiratory Syncytial Virus: NOT DETECTED

## 2017-11-28 LAB — CBC
HEMATOCRIT: 44.5 % (ref 39.0–52.0)
Hemoglobin: 14.8 g/dL (ref 13.0–17.0)
MCH: 32.1 pg (ref 26.0–34.0)
MCHC: 33.3 g/dL (ref 30.0–36.0)
MCV: 96.5 fL (ref 78.0–100.0)
PLATELETS: 243 10*3/uL (ref 150–400)
RBC: 4.61 MIL/uL (ref 4.22–5.81)
RDW: 13.2 % (ref 11.5–15.5)
WBC: 16.7 10*3/uL — AB (ref 4.0–10.5)

## 2017-11-28 LAB — I-STAT ARTERIAL BLOOD GAS, ED
ACID-BASE DEFICIT: 3 mmol/L — AB (ref 0.0–2.0)
Bicarbonate: 23.5 mmol/L (ref 20.0–28.0)
O2 Saturation: 99 %
PO2 ART: 167 mmHg — AB (ref 83.0–108.0)
Patient temperature: 98.8
TCO2: 25 mmol/L (ref 22–32)
pCO2 arterial: 48.3 mmHg — ABNORMAL HIGH (ref 32.0–48.0)
pH, Arterial: 7.295 — ABNORMAL LOW (ref 7.350–7.450)

## 2017-11-28 LAB — HIV ANTIBODY (ROUTINE TESTING W REFLEX): HIV Screen 4th Generation wRfx: NONREACTIVE

## 2017-11-28 LAB — I-STAT CHEM 8, ED
BUN: 11 mg/dL (ref 6–20)
CALCIUM ION: 1.13 mmol/L — AB (ref 1.15–1.40)
CHLORIDE: 102 mmol/L (ref 98–111)
Creatinine, Ser: 0.8 mg/dL (ref 0.61–1.24)
Glucose, Bld: 136 mg/dL — ABNORMAL HIGH (ref 70–99)
HCT: 47 % (ref 39.0–52.0)
HEMOGLOBIN: 16 g/dL (ref 13.0–17.0)
POTASSIUM: 2.9 mmol/L — AB (ref 3.5–5.1)
SODIUM: 142 mmol/L (ref 135–145)
TCO2: 24 mmol/L (ref 22–32)

## 2017-11-28 LAB — RAPID URINE DRUG SCREEN, HOSP PERFORMED
AMPHETAMINES: NOT DETECTED
BARBITURATES: NOT DETECTED
Benzodiazepines: NOT DETECTED
Cocaine: POSITIVE — AB
OPIATES: NOT DETECTED
Tetrahydrocannabinol: POSITIVE — AB

## 2017-11-28 MED ORDER — HYDRALAZINE HCL 20 MG/ML IJ SOLN: 5.0000 mg | INTRAMUSCULAR | Status: DC | PRN

## 2017-11-28 MED ORDER — METHYLPREDNISOLONE SODIUM SUCC 125 MG IJ SOLR
60.0000 mg | Freq: Three times a day (TID) | INTRAMUSCULAR | Status: DC
  Administered 2017-11-28 – 2017-11-30 (×8): 60 mg via INTRAVENOUS
  Filled 2017-11-28 (×8): qty 2

## 2017-11-28 MED ORDER — IPRATROPIUM BROMIDE 0.02 % IN SOLN
0.5000 mg | Freq: Four times a day (QID) | RESPIRATORY_TRACT | Status: DC
  Administered 2017-11-28 – 2017-11-30 (×6): 0.5 mg via RESPIRATORY_TRACT
  Filled 2017-11-28 (×7): qty 2.5

## 2017-11-28 MED ORDER — AZITHROMYCIN 500 MG PO TABS
250.0000 mg | ORAL_TABLET | Freq: Every day | ORAL | Status: DC
  Administered 2017-11-29 – 2017-11-30 (×2): 250 mg via ORAL
  Filled 2017-11-28 (×2): qty 1

## 2017-11-28 MED ORDER — LEVALBUTEROL HCL 1.25 MG/0.5ML IN NEBU
1.2500 mg | INHALATION_SOLUTION | Freq: Four times a day (QID) | RESPIRATORY_TRACT | Status: DC
  Administered 2017-11-28: 1.25 mg via RESPIRATORY_TRACT
  Filled 2017-11-28: qty 0.5

## 2017-11-28 MED ORDER — ONDANSETRON HCL 4 MG/2ML IJ SOLN
4.0000 mg | Freq: Three times a day (TID) | INTRAMUSCULAR | Status: DC | PRN
  Filled 2017-11-28: qty 2

## 2017-11-28 MED ORDER — LEVALBUTEROL HCL 1.25 MG/0.5ML IN NEBU
1.2500 mg | INHALATION_SOLUTION | Freq: Four times a day (QID) | RESPIRATORY_TRACT | Status: DC
  Administered 2017-11-28 – 2017-11-30 (×6): 1.25 mg via RESPIRATORY_TRACT
  Filled 2017-11-28 (×7): qty 0.5

## 2017-11-28 MED ORDER — IOPAMIDOL (ISOVUE-300) INJECTION 61%
100.0000 mL | Freq: Once | INTRAVENOUS | Status: AC | PRN
  Administered 2017-11-28: 50 mL via INTRAVENOUS

## 2017-11-28 MED ORDER — ZOLPIDEM TARTRATE 5 MG PO TABS
5.0000 mg | ORAL_TABLET | Freq: Every evening | ORAL | Status: DC | PRN
  Administered 2017-11-29: 5 mg via ORAL
  Filled 2017-11-28: qty 1

## 2017-11-28 MED ORDER — POTASSIUM CHLORIDE CRYS ER 20 MEQ PO TBCR
60.0000 meq | EXTENDED_RELEASE_TABLET | Freq: Once | ORAL | Status: AC
  Administered 2017-11-28: 60 meq via ORAL
  Filled 2017-11-28: qty 3

## 2017-11-28 MED ORDER — AZITHROMYCIN 250 MG PO TABS
500.0000 mg | ORAL_TABLET | Freq: Every day | ORAL | Status: AC
  Administered 2017-11-28: 500 mg via ORAL
  Filled 2017-11-28: qty 2

## 2017-11-28 MED ORDER — ENOXAPARIN SODIUM 40 MG/0.4ML ~~LOC~~ SOLN
40.0000 mg | SUBCUTANEOUS | Status: DC
  Administered 2017-11-29 – 2017-11-30 (×2): 40 mg via SUBCUTANEOUS
  Filled 2017-11-28 (×2): qty 0.4

## 2017-11-28 MED ORDER — ACETAMINOPHEN 325 MG PO TABS: 650.0000 mg | ORAL_TABLET | Freq: Four times a day (QID) | ORAL | Status: DC | PRN

## 2017-11-28 MED ORDER — LEVALBUTEROL HCL 0.63 MG/3ML IN NEBU
0.6300 mg | INHALATION_SOLUTION | Freq: Four times a day (QID) | RESPIRATORY_TRACT | Status: DC | PRN
  Administered 2017-11-28 (×2): 0.63 mg via RESPIRATORY_TRACT
  Filled 2017-11-28 (×2): qty 3

## 2017-11-28 MED ORDER — IOPAMIDOL (ISOVUE-370) INJECTION 76%
INTRAVENOUS | Status: AC
  Filled 2017-11-28: qty 100

## 2017-11-28 MED ORDER — SODIUM CHLORIDE 0.9 % IV BOLUS
1000.0000 mL | Freq: Once | INTRAVENOUS | Status: AC
  Administered 2017-11-28: 1000 mL via INTRAVENOUS

## 2017-11-28 MED ORDER — NICOTINE 21 MG/24HR TD PT24
21.0000 mg | MEDICATED_PATCH | Freq: Every day | TRANSDERMAL | Status: DC
  Administered 2017-11-28 – 2017-11-30 (×2): 21 mg via TRANSDERMAL
  Filled 2017-11-28 (×3): qty 1

## 2017-11-28 MED ORDER — ALBUTEROL (5 MG/ML) CONTINUOUS INHALATION SOLN
10.0000 mg/h | INHALATION_SOLUTION | Freq: Once | RESPIRATORY_TRACT | Status: AC
  Administered 2017-11-28: 10 mg/h via RESPIRATORY_TRACT

## 2017-11-28 MED ORDER — DM-GUAIFENESIN ER 30-600 MG PO TB12: 1.0000 | ORAL_TABLET | Freq: Two times a day (BID) | ORAL | Status: DC | PRN

## 2017-11-28 MED ORDER — SODIUM CHLORIDE 0.9 % IV SOLN
INTRAVENOUS | Status: DC
  Administered 2017-11-28 – 2017-11-30 (×6): via INTRAVENOUS

## 2017-11-28 MED ORDER — IPRATROPIUM BROMIDE 0.02 % IN SOLN: 0.5000 mg | RESPIRATORY_TRACT | Status: DC

## 2017-11-28 MED ORDER — LORAZEPAM 2 MG/ML IJ SOLN
0.5000 mg | Freq: Once | INTRAMUSCULAR | Status: AC
  Administered 2017-11-28: 0.5 mg via INTRAVENOUS
  Filled 2017-11-28: qty 1

## 2017-11-28 NOTE — H&P (Addendum)
History and Physical    Raymond Neal:[id number] DOB: January 25, 1991 DOA: 11/27/2017  Referring MD/NP/PA:   PCP: Patient, No Pcp Per   Patient coming from:  The patient is coming from home.  At baseline, pt is independent for most of ADL.   Chief Complaint: cough, shortness of breath, wheezing, chest pain  HPI: Raymond Neal is a 27 y.o. male with medical history significant of asthma, tobacco abuse, marijuana abuse, who presents with cough, shortness of breath, wheezing and chest pain.  Patient states that he he has been coughing for whole day, and he started having shortness of breath at about 5 PM, which has been progressively getting worse.  He coughs up clear mucus.  Denies fever or chills. Pt has severe respiratory distress, in tripod position initially, could not speak in full sentence.  Patient also reports pleuritic chest pain, which is located in the lower chest, constant, sharp, nonradiating, moderate.  No recent long distance traveling.  Denies tenderness in in calf areas.  Patient has nausea, no vomiting, diarrhea or abdominal pain.  No symptoms of UTI or unilateral weakness.  Patient received 1 dose of Solu-Medrol and 2 g of magnesium sulfate with a slight improvement.  ED Course: pt was found to have WBC 16.7, potassium 2.9, creatinine normal, tachycardia, tachypnea, oxygen saturation 85% on room air, chest x-ray showed bronchitic change.  Patient is placed on telemetry bed for observation.   Review of Systems:   General: no fevers, chills, no body weight gain, has fatigue HEENT: no blurry vision, hearing changes or sore throat Respiratory: has dyspnea, coughing, wheezing CV: has chest pain, no palpitations GI: has nausea, no vomiting, abdominal pain, diarrhea, constipation GU: no dysuria, burning on urination, increased urinary frequency, hematuria  Ext: no leg edema Neuro: no unilateral weakness, numbness, or tingling, no vision change or hearing loss Skin: no rash, no  skin tear. MSK: No muscle spasm, no deformity, no limitation of range of movement in spin Heme: No easy bruising.  Travel history: No recent long distant travel.  Allergy: No Known Allergies  Past Medical History:  Diagnosis Date  . Asthma     Past Surgical History:  Procedure Laterality Date  . CLAVICLE SURGERY Right 2014    Social History:  reports that he has been smoking cigarettes. He has been smoking about 0.50 packs per day for the past 0.00 years. He has never used smokeless tobacco. He reports that he has current or past drug history. Drug: Marijuana. Frequency: 1.00 time per week. He reports that he does not drink alcohol.  Family History:  Family History  Problem Relation Age of Onset  . Hypertension Father      Prior to Admission medications   Medication Sig Start Date End Date Taking? Authorizing Provider  albuterol (PROVENTIL HFA;VENTOLIN HFA) 108 (90 Base) MCG/ACT inhaler Inhale 2 puffs into the lungs every 4 (four) hours as needed for wheezing or shortness of breath. 07/19/17  Yes Cristina Gong, PA-C    Physical Exam: Vitals:   11/28/17 0315 11/28/17 0330 11/28/17 0345 11/28/17 0353  BP: 139/79  124/80   Pulse: (!) 126  (!) 124   Resp: (!) 32  (!) 23   Temp:    98.8 F (37.1 C)  TempSrc:    Oral  SpO2: 91% 90% 90%   Weight:      Height:       General: Not in acute distress HEENT:       Eyes: PERRL,  EOMI, no scleral icterus.       ENT: No discharge from the ears and nose, no pharynx injection, no tonsillar enlargement.        Neck: No JVD, no bruit, no mass felt. Heme: No neck lymph node enlargement. Cardiac: S1/S2, RRR, No murmurs, No gallops or rubs. Respiratory: Has diffused wheezing bilaterally  GI: Soft, nondistended, nontender, no rebound pain, no organomegaly, BS present. GU: No hematuria Ext: No pitting leg edema bilaterally. 2+DP/PT pulse bilaterally. Musculoskeletal: No joint deformities, No joint redness or warmth, no limitation of  ROM in spin. Skin: No rashes.  Neuro: Alert, oriented X3, cranial nerves II-XII grossly intact, moves all extremities normally.  Psych: Patient is not psychotic, no suicidal or hemocidal ideation.  Labs on Admission: I have personally reviewed following labs and imaging studies  CBC: Recent Labs  Lab 11/28/17 0159 11/28/17 0249  WBC 16.7*  --   HGB 14.8 16.0  HCT 44.5 47.0  MCV 96.5  --   PLT 243  --    Basic Metabolic Panel: Recent Labs  Lab 11/28/17 0249  NA 142  K 2.9*  CL 102  GLUCOSE 136*  BUN 11  CREATININE 0.80   GFR: Estimated Creatinine Clearance: 124.6 mL/min (by C-G formula based on SCr of 0.8 mg/dL). Liver Function Tests: No results for input(s): AST, ALT, ALKPHOS, BILITOT, PROT, ALBUMIN in the last 168 hours. No results for input(s): LIPASE, AMYLASE in the last 168 hours. No results for input(s): AMMONIA in the last 168 hours. Coagulation Profile: No results for input(s): INR, PROTIME in the last 168 hours. Cardiac Enzymes: No results for input(s): CKTOTAL, CKMB, CKMBINDEX, TROPONINI in the last 168 hours. BNP (last 3 results) No results for input(s): PROBNP in the last 8760 hours. HbA1C: No results for input(s): HGBA1C in the last 72 hours. CBG: No results for input(s): GLUCAP in the last 168 hours. Lipid Profile: No results for input(s): CHOL, HDL, LDLCALC, TRIG, CHOLHDL, LDLDIRECT in the last 72 hours. Thyroid Function Tests: No results for input(s): TSH, T4TOTAL, FREET4, T3FREE, THYROIDAB in the last 72 hours. Anemia Panel: No results for input(s): VITAMINB12, FOLATE, FERRITIN, TIBC, IRON, RETICCTPCT in the last 72 hours. Urine analysis: No results found for: COLORURINE, APPEARANCEUR, LABSPEC, PHURINE, GLUCOSEU, HGBUR, BILIRUBINUR, KETONESUR, PROTEINUR, UROBILINOGEN, NITRITE, LEUKOCYTESUR Sepsis Labs: @LABRCNTIP (procalcitonin:4,lacticidven:4) )No results found for this or any previous visit (from the past 240 hour(s)).   Radiological Exams on  Admission: Dg Chest 2 View  Result Date: 11/28/2017 CLINICAL DATA:  Cough. EXAM: CHEST - 2 VIEW COMPARISON:  07/13/2013 FINDINGS: The lungs are hyperinflated with central bronchial thickening.The cardiomediastinal contours are normal. Pulmonary vasculature is normal. No consolidation, pleural effusion, or pneumothorax. No acute osseous abnormalities are seen. IMPRESSION: Hyperinflation and central bronchial thickening suggesting asthma or bronchitis. Electronically Signed   By: Rubye Oaks M.D.   On: 11/28/2017 01:13     EKG: Independently reviewed.  Sinus tachycardia, QTC 433, anteroseptal infarction pattern.  Assessment/Plan Active Problems:   Acute asthma exacerbation   Tobacco abuse   Hypokalemia   Acute respiratory failure with hypoxia (HCC)   Acute respiratory failure with hypoxia due to acute asthma exacerbation: Patient is acute respiratory distress most likely due to asthma exacerbation given diffuse wheezing on lung auscultation.  Chest x-ray did not show infiltration, but showed bronchitis continue.  Patient also reports pleuritic chest pain, aggravated by deep breath.  Will need to rule out PE.  -will place on SDU for obs -Nebulizers: scheduled Atrovent and prn  Xopenex Nebs -Solu-Medrol 60 mg IV tid -Z pak -Mucinex for cough  -Urine drug screen -Follow up blood culture x2, sputum culture, respiratory virus panel -Nasal cannula oxygen as needed to maintain O2 saturation 92% or greater - will get CTA to r/o PE-->negative for PE - check ABG. - if no improvement-->will start BiPAP  Tobacco abuse: -Nicotine patch  Hypokalemia: K= 2.9 on admission. - Repleted with KCl 60 mEq - Check Mg level - received 2 g of magnesium sulfate which is also for asthma    DVT ppx:  SQ Lovenox Code Status: Full code Family Communication:   Yes, patient's mother   at bed side Disposition Plan:  Anticipate discharge back to previous home environment Consults called:   none Admission status:  SDU/obs     Date of Service 11/28/2017    Lorretta HarpXilin Momodou Consiglio [facility] Pager [phone]  If 7PM-7AM, please contact night-coverage [email or web address] Password [email or web address] 11/28/2017, 4:25 AM

## 2017-11-28 NOTE — Progress Notes (Signed)
Patient is a 27 year old male with past medical history of intermittent asthma, tobacco abuse, marijuana abuse who presented to the emergency department with cough, shortness of breath, wheezing and chest pain.  Patient was found to be in acute asthma attack.  Also found to have severe hypokalemia.  Cocaine and marijuana was positive in the UDS. Started on steroids and admitted for further management. Patient seen and examined the bedside in the emergency department this morning.  He was still mildly tachycardic.  His respiratory status is improving.  He still has bilateral expiratory wheezes.  We will continue the current management for now.  I have counseled him for cessation of smoking.  His mother was present at the bedside. Patient seen by Dr. Clyde LundborgNiu this morning.

## 2017-11-28 NOTE — ED Notes (Addendum)
Pt. Removed self from monitor. When asked what I can do to help, pt would not respond to me. Pt. Currently on nonrebreather and is sleeping with unlabored respirations.

## 2017-11-28 NOTE — ED Provider Notes (Addendum)
[facility]   CSN: [id number] Arrival date & time: 11/27/17  2252     History   Chief Complaint No chief complaint on file.   HPI Raymond GreenspanJohn W Neal is a 27 y.o. male.  HPI  27 year old with history of asthma comes in with chief complaint of shortness of breath and respiratory distress. Patient reports that he was feeling well when he went to work, later in the evening he started coughing followed by wheezing and shortness of breath.  Patient reports that his cough is producing clear phlegm.  Patient thinks that the coughing led to his asthma attack.  His symptoms rapidly progressed once they started.  When EMS arrived, they noted that patient was diaphoretic and in respiratory distress.  Patient was tripoding and hypoxic.  Patient had received DuoNeb followed by albuterol prior to ED arrival.  That also given and Solu-Medrol.  Patient states that his asthma is well controlled.  He had not been to the ED with complains of asthma for more than a year.  He does smoke, however denies any specific exposures prior to his symptoms today.  Patient denies any fevers, chills. Pt has no hx of PE, DVT and denies any exogenous hormone (testosterone / estrogen) use, long distance travels or surgery in the past 6 weeks, active cancer, recent immobilization.   Past Medical History:  Diagnosis Date  . Asthma     Patient Active Problem List   Diagnosis Date Noted  . Acute bronchitis 07/14/2013  . Acute asthma exacerbation 07/13/2013  . Tachycardia 07/13/2013  . Asthma 02/10/2011    Past Surgical History:  Procedure Laterality Date  . CLAVICLE SURGERY Right 2014        Home Medications    Prior to Admission medications   Medication Sig Start Date End Date Taking? Authorizing Provider  albuterol (PROVENTIL HFA;VENTOLIN HFA) 108 (90 Base) MCG/ACT inhaler Inhale 2 puffs into the lungs every 4 (four) hours as needed for wheezing or  shortness of breath. 07/19/17  Yes Cristina GongHammond, Elizabeth W, PA-C    Family History History reviewed. No pertinent family history.  Social History Social History   Tobacco Use  . Smoking status: Current Every Day Smoker    Packs/day: 0.50    Years: 0.00    Pack years: 0.00    Types: Cigarettes  . Smokeless tobacco: Never Used  Substance Use Topics  . Alcohol use: No  . Drug use: Yes    Frequency: 1.0 times per week    Types: Marijuana     Allergies   Patient has no known allergies.   Review of Systems Review of Systems  Constitutional: Positive for activity change.  Respiratory: Positive for chest tightness, shortness of breath and wheezing.   Cardiovascular: Positive for chest pain.  Allergic/Immunologic: Negative for environmental allergies, food allergies and immunocompromised state.  Hematological: Does not bruise/bleed easily.  All other systems reviewed and are negative.    Physical Exam Updated Vital Signs BP 129/86   Pulse (!) 118   Resp (!) 22   Ht 5\' 8"  (1.727 m)   Wt 63.5 kg   SpO2 94%   BMI 21.29 kg/m   Physical Exam  Constitutional: He is oriented to person, place, and time. He appears well-developed.  HENT:  Head: Atraumatic.  Neck: Neck supple. No JVD present.  Cardiovascular: Normal rate.  Pulmonary/Chest: Effort normal. No stridor. He has wheezes.  Generalized inspiratory and expiratory wheezing  Abdominal: Soft.  Musculoskeletal:  He exhibits no edema or tenderness.  Neurological: He is alert and oriented to person, place, and time.  Skin: Skin is warm.  Nursing note and vitals reviewed.    ED Treatments / Results  Labs (all labs ordered are listed, but only abnormal results are displayed) Labs Reviewed  CBC - Abnormal; Notable for the following components:      Result Value   WBC 16.7 (*)    All other components within normal limits  I-STAT CHEM 8, ED - Abnormal; Notable for the following components:   Potassium 2.9 (*)     Glucose, Bld 136 (*)    Calcium, Ion 1.13 (*)    All other components within normal limits    EKG EKG Interpretation  Date/Time:  Thursday November 27 2017 22:59:18 EDT Ventricular Rate:  136 PR Interval:    QRS Duration: 93 QT Interval:  286 QTC Calculation: 431 R Axis:   72 Text Interpretation:  Sinus tachycardia Consider right atrial enlargement Anteroseptal infarct, old Artifact in lead(s) I II aVR aVL Confirmed by Kristine Royal [phone]) on 11/27/2017 11:02:15 PM   Radiology Dg Chest 2 View  Result Date: 11/28/2017 CLINICAL DATA:  Cough. EXAM: CHEST - 2 VIEW COMPARISON:  07/13/2013 FINDINGS: The lungs are hyperinflated with central bronchial thickening.The cardiomediastinal contours are normal. Pulmonary vasculature is normal. No consolidation, pleural effusion, or pneumothorax. No acute osseous abnormalities are seen. IMPRESSION: Hyperinflation and central bronchial thickening suggesting asthma or bronchitis. Electronically Signed   By: Rubye Oaks M.D.   On: 11/28/2017 01:13    Procedures Procedures (including critical care time)  CRITICAL CARE Performed by: Crystina Borrayo   Total critical care time: 39 minutes  Critical care time was exclusive of separately billable procedures and treating other patients.  Critical care was necessary to treat or prevent imminent or life-threatening deterioration.  Critical care was time spent personally by me on the following activities: development of treatment plan with patient and/or surrogate as well as nursing, discussions with consultants, evaluation of patient's response to treatment, examination of patient, obtaining history from patient or surrogate, ordering and performing treatments and interventions, ordering and review of laboratory studies, ordering and review of radiographic studies, pulse oximetry and re-evaluation of patient's condition.   Medications Ordered in ED Medications  potassium chloride SA (K-DUR,KLOR-CON)  CR tablet 60 mEq (has no administration in time range)  albuterol (PROVENTIL,VENTOLIN) solution continuous neb (10 mg/hr Nebulization Given 11/27/17 2306)  magnesium sulfate IVPB 2 g 50 mL (0 g Intravenous Stopped 11/28/17 0101)  albuterol (PROVENTIL,VENTOLIN) solution continuous neb (10 mg/hr Nebulization Given 11/28/17 0203)     Initial Impression / Assessment and Plan / ED Course  I have reviewed the triage vital signs and the nursing notes.  Pertinent labs & imaging results that were available during my care of the patient were reviewed by me and considered in my medical decision making (see chart for details).  Clinical Course as of Nov 29 323  Fri Nov 28, 2017  0100 Improved aeration.  Wheezing is persistent. Magnesium ordered.  We will reassess.   [AN]  0200 Upon reassessment patient has persistent wheezing.  His O2 sats dropped to 85 without oxygen even before he was ambulated.  Patient also reports getting dizzy and feeling like he might faint. We will admit him. At this time suspicion is that patient has severe asthma exacerbation.  We do not think patient has PE, however if he continues to have shortness of breath, hypoxia and wheezing despite  multiple nebs he might need CT scan.   [AN]  0324 Based on our evaluation thus far, patient does not have severe sepsis. He will be admitted for asthma exacerbation.   [AN]    Clinical Course User Index [AN] Derwood Kaplan, MD    27 year old male comes in with chief complaint of shortness of breath.  Patient was noted to be in acute respiratory failure per EMS.  He arrives ED in respiratory distress, with wheezing all over.  Patient however is moving air better than when EMS first encountered him based on the report given by EMS.  Patient is also feeling better.  Based on the history, patient does not have poorly controlled asthma.  What is concerning is that patient rapidly decompensated at home.  Clinical suspicion is extremely low  for pneumonia, PE.  Pulmonary edema is possible, but also less likely.  We will give hour-long albuterol treatment and reassess.  Magnesium also has been ordered.  Final Clinical Impressions(s) / ED Diagnoses   Final diagnoses:  Acute respiratory failure with hypoxia (HCC)  Severe persistent asthma with acute exacerbation  Acute hypokalemia    ED Discharge Orders    None       Derwood Kaplan, MD 11/28/17 5409    Derwood Kaplan, MD 11/28/17 [phone]

## 2017-11-28 NOTE — Progress Notes (Signed)
RT called to pt room to assess pt. Pt SPO2 was 85% on 3LNC. MD ordered BIPAP but decided to place pt on NRB @ 100% and draw an ABG for baseline numbers. Pt tolerating NRB, Sats have improved to 96%.  Pts ABG was:  PH 7.30 PCO2 48 HCO3 24 PaO2 167  Pt sitting up in bed in tripod position, diaphoretic, and c/o chest tightness. Pt has recently had a CAT 10mg /hr x 1 hr. MD wants to monitor pt and revisit BIPAP after pt has a scan and some time has passed. RT will continue to monitor.

## 2017-11-28 NOTE — ED Notes (Signed)
Pt. To CT via stretcher. 

## 2017-11-28 NOTE — ED Notes (Signed)
Nurse collecting 2nd set blood cultures.  Clicked off in error.

## 2017-11-28 NOTE — ED Notes (Signed)
Patient removed self off of oxygen and tangled in monitor. Patient repositioned and placed back on 4L . Respiratory called for PRN breathing treatment.

## 2017-11-28 NOTE — ED Notes (Addendum)
Per Respiratory, Pt requested to be intubated due to feeling tired. Toniann FailWendy told this nurse. MD made aware

## 2017-11-28 NOTE — Progress Notes (Addendum)
RT entered pt room to give xopenex treatment. Pt sleeping appears to be resting well. Once pt woke up he became very anxious and stating he could not breathe. RT will continue to monitor.

## 2017-11-28 NOTE — Progress Notes (Signed)
RT called to pt room to start a second CAT 10mg /hr x 1 hr. MD informed this RT pt would be staying overnight for observation r/t pt desaturation into the 80s. RT will continue to monitor.

## 2017-11-28 NOTE — ED Notes (Signed)
Pt. To XRAY via stretcher. 

## 2017-11-29 ENCOUNTER — Other Ambulatory Visit: Payer: Self-pay

## 2017-11-29 LAB — BASIC METABOLIC PANEL
ANION GAP: 10 (ref 5–15)
BUN: 12 mg/dL (ref 6–20)
CALCIUM: 9.2 mg/dL (ref 8.9–10.3)
CO2: 24 mmol/L (ref 22–32)
Chloride: 107 mmol/L (ref 98–111)
Creatinine, Ser: 0.8 mg/dL (ref 0.61–1.24)
GFR calc non Af Amer: >60 mL/min (ref >60)
Glucose, Bld: 92 mg/dL (ref 70–99)
Potassium: 4.2 mmol/L (ref 3.5–5.1)
SODIUM: 141 mmol/L (ref 135–145)

## 2017-11-29 LAB — CBC WITH DIFFERENTIAL/PLATELET
Abs Immature Granulocytes: 0.1 10*3/uL (ref 0.0–0.1)
BASOS ABS: 0 10*3/uL (ref 0.0–0.1)
Basophils Relative: 0 %
EOS PCT: 0 %
Eosinophils Absolute: 0 10*3/uL (ref 0.0–0.7)
HCT: 41.5 % (ref 39.0–52.0)
HEMOGLOBIN: 14.2 g/dL (ref 13.0–17.0)
IMMATURE GRANULOCYTES: 1 %
LYMPHS ABS: 0.9 10*3/uL (ref 0.7–4.0)
Lymphocytes Relative: 5 %
MCH: 32.7 pg (ref 26.0–34.0)
MCHC: 34.2 g/dL (ref 30.0–36.0)
MCV: 95.6 fL (ref 78.0–100.0)
Monocytes Absolute: 0.8 10*3/uL (ref 0.1–1.0)
Monocytes Relative: 4 %
NEUTROS PCT: 90 %
Neutro Abs: 16 10*3/uL — ABNORMAL HIGH (ref 1.7–7.7)
Platelets: 230 10*3/uL (ref 150–400)
RBC: 4.34 MIL/uL (ref 4.22–5.81)
RDW: 13.5 % (ref 11.5–15.5)
WBC: 17.7 10*3/uL — AB (ref 4.0–10.5)

## 2017-11-29 LAB — EXPECTORATED SPUTUM ASSESSMENT W REFEX TO RESP CULTURE

## 2017-11-29 LAB — MRSA PCR SCREENING: MRSA by PCR: NEGATIVE

## 2017-11-29 LAB — EXPECTORATED SPUTUM ASSESSMENT W GRAM STAIN, RFLX TO RESP C

## 2017-11-29 LAB — MAGNESIUM: MAGNESIUM: 2.1 mg/dL (ref 1.7–2.4)

## 2017-11-29 MED ORDER — POLYETHYLENE GLYCOL 3350 17 G PO PACK
17.0000 g | PACK | Freq: Every day | ORAL | Status: DC | PRN
  Administered 2017-11-29: 17 g via ORAL
  Filled 2017-11-29: qty 1

## 2017-11-29 NOTE — Progress Notes (Signed)
Nutrition Brief Note  Pt identified on the Malnutrition Screening Tool (MST) Report.  He was admitted with acute asthma exacerbation.  Wt Readings from Last 15 Encounters:  11/27/17 63.5 kg  07/19/17 68 kg  01/14/17 68 kg  07/18/16 68 kg  03/17/16 72.3 kg  11/07/15 62.6 kg  07/13/13 59.3 kg  07/13/13 64 kg  11/25/11 66.7 kg  01/22/11 66.7 kg   Body mass index is 21.29 kg/m. Patient meets criteria for Normal based on current BMI.   Current diet order is Regular. Reports his appetite is getting better. Declined addition of oral nutrition supplements.  No nutrition interventions warranted at this time. If nutrition issues arise, please consult RD.   Maureen ChattersKatie Riyana Biel, RD, LDN Pager #: [phone] After-Hours Pager #: [phone]

## 2017-11-29 NOTE — Progress Notes (Addendum)
PROGRESS NOTE  Raymond Neal VHQ:[id number] DOB: 07-19-1990 DOA: 11/27/2017 PCP: Patient, No Pcp Per  HPI/Recap of past 24 hours:  HPI: Raymond Neal is a 27 y.o. male with medical history significant of asthma, tobacco abuse, marijuana abuse, who presents with cough, shortness of breath, wheezing and chest pain.  Patient states that he he has been coughing for whole day, and he started having shortness of breath at about 5 PM, which has been progressively getting worse.  He coughs up clear mucus.  Denies fever or chills. Pt has severe respiratory distress, in tripod position initially, could not speak in full sentence.  Patient also reports pleuritic chest pain, which is located in the lower chest, constant, sharp, nonradiating, moderate.  No recent long distance traveling.  Denies tenderness in in calf areas.  Patient has nausea, no vomiting, diarrhea or abdominal pain.  No symptoms of UTI or unilateral weakness.  Patient received 1 dose of Solu-Medrol and 2 g of magnesium sulfate with a slight improvement.  Assessment/Plan: Active Problems:   Acute asthma exacerbation   Tobacco abuse   Hypokalemia   Asthma exacerbation   Acute respiratory failure with hypoxia (HCC)  Acute respiratory failure with hypoxia due to acute asthma exacerbation: Patient is acute respiratory distress most likely due to asthma exacerbation given diffuse wheezing on lung auscultation.  Chest x-ray did not show infiltration, but showed bronchitis continue.  Patient also reports pleuritic chest pain, aggravated by deep breath.  Will need to rule out PE.  -will place on SDU for obs -Nebulizers: scheduled Atrovent and prn Xopenex Nebs -Solu-Medrol 60 mg IV tid -Z pak -Mucinex for cough  -Urine drug screen -Follow up blood culture x2, sputum culture, respiratory virus panel -Nasal cannula oxygen as neededto maintain O2 saturation 92% or greater - will get CTA to r/o PE-->negative for PE - check ABG. - if no  improvement-->will start BiPAP  Tobacco abuse: -Nicotine patch  Hypokalemia: K= 2.9 on admission.  Resolved - Repleted with KCl 60 mEq - Check Mg level - received 2 g of magnesium sulfate which is also for asthma  Leukocytosis patient is on Zithromax may be due to IV steroids recheck in the morning  Positive rhinovirus/enterovirus.  Patient is on droplet isolation  DVT ppx:  SQ Lovenox Code Status: Full code Family Communication:  none   at bed side Disposition Plan:  Anticipate discharge back to previous home environment Consults called:  none Admission status:  SDU/obs    Procedures:  none  Antimicrobials:  Z-Pak    Objective: Vitals:   11/29/17 1006 11/29/17 1409  BP: 122/81   Pulse: (!) 102   Resp: 19   Temp: 98.8 F (37.1 C)   SpO2: 100% 92%    Intake/Output Summary (Last 24 hours) at 11/29/2017 1914 Last data filed at 11/29/2017 1853 Gross per 24 hour  Intake 3929.84 ml  Output 450 ml  Net 3479.84 ml   Filed Weights   11/27/17 2300  Weight: 63.5 kg   Body mass index is 21.29 kg/m.  Exam:  General: Not in acute distress HEENT:       Eyes: PERRL, EOMI, no scleral icterus.       ENT: No discharge from the ears and nose, no pharynx injection, no tonsillar enlargement.        Neck: No JVD, no bruit, no mass felt. Heme: No neck lymph node enlargement. Cardiac: S1/S2, RRR, No murmurs, No gallops or rubs. Respiratory:  CTA bilaterally no wheezing  GI: Soft, nondistended, nontender, no rebound pain, no organomegaly, BS present. GU: No hematuria Ext: No pitting leg edema bilaterally. 2+DP/PT pulse bilaterally. Musculoskeletal: No joint deformities, No joint redness or warmth, no limitation of ROM in spin. Skin: No rashes.  Neuro: Alert, oriented X3, cranial nerves II-XII grossly intact, moves all extremities normally.  Psych: Patient is not psychotic, no suicidal or hemocidal ideation.   Data Reviewed: CBC: Recent Labs  Lab 11/28/17 0159  11/28/17 0249 11/29/17 0417  WBC 16.7*  --  17.7*  NEUTROABS  --   --  16.0*  HGB 14.8 16.0 14.2  HCT 44.5 47.0 41.5  MCV 96.5  --  95.6  PLT 243  --  230   Basic Metabolic Panel: Recent Labs  Lab 11/28/17 0249 11/29/17 0417  NA 142 141  K 2.9* 4.2  CL 102 107  CO2  --  24  GLUCOSE 136* 92  BUN 11 12  CREATININE 0.80 0.80  CALCIUM  --  9.2  MG  --  2.1   GFR: Estimated Creatinine Clearance: 124.6 mL/min (by C-G formula based on SCr of 0.8 mg/dL). Liver Function Tests: No results for input(s): AST, ALT, ALKPHOS, BILITOT, PROT, ALBUMIN in the last 168 hours. No results for input(s): LIPASE, AMYLASE in the last 168 hours. No results for input(s): AMMONIA in the last 168 hours. Coagulation Profile: No results for input(s): INR, PROTIME in the last 168 hours. Cardiac Enzymes: No results for input(s): CKTOTAL, CKMB, CKMBINDEX, TROPONINI in the last 168 hours. BNP (last 3 results) No results for input(s): PROBNP in the last 8760 hours. HbA1C: No results for input(s): HGBA1C in the last 72 hours. CBG: No results for input(s): GLUCAP in the last 168 hours. Lipid Profile: No results for input(s): CHOL, HDL, LDLCALC, TRIG, CHOLHDL, LDLDIRECT in the last 72 hours. Thyroid Function Tests: No results for input(s): TSH, T4TOTAL, FREET4, T3FREE, THYROIDAB in the last 72 hours. Anemia Panel: No results for input(s): VITAMINB12, FOLATE, FERRITIN, TIBC, IRON, RETICCTPCT in the last 72 hours. Urine analysis: No results found for: COLORURINE, APPEARANCEUR, LABSPEC, PHURINE, GLUCOSEU, HGBUR, BILIRUBINUR, KETONESUR, PROTEINUR, UROBILINOGEN, NITRITE, LEUKOCYTESUR Sepsis Labs: @LABRCNTIP (procalcitonin:4,lacticidven:4)  ) Recent Results (from the past 240 hour(s))  Culture, blood (Routine X 2) w Reflex to ID Panel     Status: None (Preliminary result)   Collection Time: 11/28/17  4:08 AM  Result Value Ref Range Status   Specimen Description BLOOD RIGHT ANTECUBITAL  Final   Special  Requests   Final    BOTTLES DRAWN AEROBIC AND ANAEROBIC Blood Culture adequate volume   Culture   Final    NO GROWTH 1 DAY Performed at [facility], [address] [place], [place] [address]    Report Status PENDING  Incomplete  Culture, blood (Routine X 2) w Reflex to ID Panel     Status: None (Preliminary result)   Collection Time: 11/28/17  4:45 AM  Result Value Ref Range Status   Specimen Description BLOOD RIGHT FOREARM  Final   Special Requests   Final    BOTTLES DRAWN AEROBIC AND ANAEROBIC Blood Culture adequate volume   Culture   Final    NO GROWTH 1 DAY Performed at [facility], [address] [place], [place] [address]    Report Status PENDING  Incomplete  Respiratory Panel by PCR     Status: Abnormal   Collection Time: 11/28/17  2:28 PM  Result Value Ref Range Status   Adenovirus NOT DETECTED NOT  DETECTED Final   Coronavirus 229E NOT DETECTED NOT DETECTED Final   Coronavirus HKU1 NOT DETECTED NOT DETECTED Final   Coronavirus NL63 NOT DETECTED NOT DETECTED Final   Coronavirus OC43 NOT DETECTED NOT DETECTED Final   Metapneumovirus NOT DETECTED NOT DETECTED Final   Rhinovirus / Enterovirus DETECTED (A) NOT DETECTED Final   Influenza A NOT DETECTED NOT DETECTED Final   Influenza B NOT DETECTED NOT DETECTED Final   Parainfluenza Virus 1 NOT DETECTED NOT DETECTED Final   Parainfluenza Virus 2 NOT DETECTED NOT DETECTED Final   Parainfluenza Virus 3 NOT DETECTED NOT DETECTED Final   Parainfluenza Virus 4 NOT DETECTED NOT DETECTED Final   Respiratory Syncytial Virus NOT DETECTED NOT DETECTED Final   Bordetella pertussis NOT DETECTED NOT DETECTED Final   Chlamydophila pneumoniae NOT DETECTED NOT DETECTED Final   Mycoplasma pneumoniae NOT DETECTED NOT DETECTED Final    Comment: Performed at [facility], [address] [place], [place] [address]  MRSA PCR Screening     Status: None   Collection Time: 11/29/17  1:53 AM  Result Value Ref Range Status    MRSA by PCR NEGATIVE NEGATIVE Final    Comment:        The GeneXpert MRSA Assay (FDA approved for NASAL specimens only), is one component of a comprehensive MRSA colonization surveillance program. It is not intended to diagnose MRSA infection nor to guide or monitor treatment for MRSA infections. Performed at [facility], [address] [place], [place] [address]   Expectorated sputum assessment w rflx to resp cult     Status: None   Collection Time: 11/29/17 12:06 PM  Result Value Ref Range Status   Specimen Description SPUTUM  Final   Special Requests NONE  Final   Sputum evaluation   Final    THIS SPECIMEN IS ACCEPTABLE FOR SPUTUM CULTURE Performed at [facility], [address] [place], [place] [address]    Report Status 11/29/2017 FINAL  Final  Culture, respiratory     Status: None (Preliminary result)   Collection Time: 11/29/17 12:06 PM  Result Value Ref Range Status   Specimen Description SPUTUM  Final   Special Requests NONE Reflexed from [id number]  Final   Gram Stain   Final    FEW WBC PRESENT,BOTH PMN AND MONONUCLEAR RARE GRAM POSITIVE COCCI IN PAIRS RARE GRAM VARIABLE ROD Performed at [facility], [address] [place], [place] [address]    Culture PENDING  Incomplete   Report Status PENDING  Incomplete      Studies: No results found.  Scheduled Meds: . azithromycin  250 mg Oral Daily  . enoxaparin (LOVENOX) injection  40 mg Subcutaneous Q24H  . ipratropium  0.5 mg Nebulization Q6H  . levalbuterol  1.25 mg Nebulization Q6H  . methylPREDNISolone (SOLU-MEDROL) injection  60 mg Intravenous TID  . nicotine  21 mg Transdermal Daily    Continuous Infusions: . sodium chloride 100 mL/hr at 11/29/17 1853     LOS: 1 day     Myrtie NeitherNwannadiya Tabitha Riggins, MD [facility]  To reach me or the doctor on call, go to: [email or web address] Password [email or web address]  11/29/2017, 7:14 PM

## 2017-11-30 DIAGNOSIS — J4551 Severe persistent asthma with (acute) exacerbation: Principal | ICD-10-CM

## 2017-11-30 DIAGNOSIS — E876 Hypokalemia: Secondary | ICD-10-CM

## 2017-11-30 LAB — CBC WITH DIFFERENTIAL/PLATELET
Abs Immature Granulocytes: 0.1 10*3/uL (ref 0.0–0.1)
Basophils Absolute: 0 10*3/uL (ref 0.0–0.1)
Basophils Relative: 0 %
EOS ABS: 0 10*3/uL (ref 0.0–0.7)
EOS PCT: 0 %
HEMATOCRIT: 40.5 % (ref 39.0–52.0)
HEMOGLOBIN: 13.7 g/dL (ref 13.0–17.0)
Immature Granulocytes: 1 %
LYMPHS ABS: 0.9 10*3/uL (ref 0.7–4.0)
LYMPHS PCT: 6 %
MCH: 32.3 pg (ref 26.0–34.0)
MCHC: 33.8 g/dL (ref 30.0–36.0)
MCV: 95.5 fL (ref 78.0–100.0)
MONO ABS: 0.7 10*3/uL (ref 0.1–1.0)
MONOS PCT: 5 %
Neutro Abs: 13.2 10*3/uL — ABNORMAL HIGH (ref 1.7–7.7)
Neutrophils Relative %: 88 %
Platelets: 239 10*3/uL (ref 150–400)
RBC: 4.24 MIL/uL (ref 4.22–5.81)
RDW: 13.7 % (ref 11.5–15.5)
WBC: 14.8 10*3/uL — ABNORMAL HIGH (ref 4.0–10.5)

## 2017-11-30 LAB — COMPREHENSIVE METABOLIC PANEL
ALT: 18 U/L (ref 0–44)
AST: 16 U/L (ref 15–41)
Albumin: 3.3 g/dL — ABNORMAL LOW (ref 3.5–5.0)
Alkaline Phosphatase: 56 U/L (ref 38–126)
Anion gap: 5 (ref 5–15)
BILIRUBIN TOTAL: 0.3 mg/dL (ref 0.3–1.2)
BUN: 15 mg/dL (ref 6–20)
CHLORIDE: 110 mmol/L (ref 98–111)
CO2: 25 mmol/L (ref 22–32)
Calcium: 8.4 mg/dL — ABNORMAL LOW (ref 8.9–10.3)
Creatinine, Ser: 0.89 mg/dL (ref 0.61–1.24)
GFR calc Af Amer: >60 mL/min (ref >60)
GLUCOSE: 103 mg/dL — AB (ref 70–99)
POTASSIUM: 3.8 mmol/L (ref 3.5–5.1)
Sodium: 140 mmol/L (ref 135–145)
Total Protein: 6.1 g/dL — ABNORMAL LOW (ref 6.5–8.1)

## 2017-11-30 MED ORDER — LEVALBUTEROL HCL 1.25 MG/0.5ML IN NEBU: 1.2500 mg | INHALATION_SOLUTION | Freq: Two times a day (BID) | RESPIRATORY_TRACT | Status: DC

## 2017-11-30 MED ORDER — BUDESONIDE-FORMOTEROL FUMARATE 160-4.5 MCG/ACT IN AERO: 2.0000 | INHALATION_SPRAY | Freq: Two times a day (BID) | RESPIRATORY_TRACT | 0 refills | Status: AC

## 2017-11-30 MED ORDER — IPRATROPIUM BROMIDE 0.02 % IN SOLN: 0.5000 mg | Freq: Two times a day (BID) | RESPIRATORY_TRACT | Status: DC

## 2017-11-30 MED ORDER — PREDNISONE 20 MG PO TABS: ORAL_TABLET | ORAL | 0 refills | Status: DC

## 2017-11-30 MED ORDER — AZITHROMYCIN 250 MG PO TABS: 250.0000 mg | ORAL_TABLET | Freq: Every day | ORAL | 0 refills | Status: AC

## 2017-11-30 MED ORDER — DM-GUAIFENESIN ER 30-600 MG PO TB12: 1.0000 | ORAL_TABLET | Freq: Two times a day (BID) | ORAL | 0 refills | Status: AC | PRN

## 2017-11-30 MED ORDER — NICOTINE 21 MG/24HR TD PT24: 21.0000 mg | MEDICATED_PATCH | Freq: Every day | TRANSDERMAL | 0 refills | Status: DC

## 2017-11-30 NOTE — Progress Notes (Signed)
Received call from unit regarding patient's need for primary care (pt had already been discharged)-  Telephone call made to patient post discharge and info given on [facility]. Instructions given to pt to call clinic in am for f/u appointment and to establish care with clinic. Per conversation with pt he did not have any difficulties filling his prescriptions today on discharge. Pt voiced understanding of instructions and states that he wrote info down.

## 2017-11-30 NOTE — Discharge Summary (Signed)
Discharge Summary  [name]:[id number] DOB: Dec 04, 1990  PCP: Patient, No Pcp Per  Admit date: 11/27/2017 Discharge date: 11/30/2017  Time spent: 25 minutes  Recommendations for Outpatient Follow-up:  1. Follow-up with primary care provider smoking cessation  Discharge Diagnoses:  Active Hospital Problems   Diagnosis Date Noted  . Tobacco abuse 11/28/2017  . Hypokalemia 11/28/2017  . Acute respiratory failure with hypoxia (HCC) 11/28/2017  . Asthma exacerbation 11/28/2017  . Acute asthma exacerbation 07/13/2013    Resolved Hospital Problems  No resolved problems to display.    Discharge Condition: Improved  Diet recommendation: Regular diet  Vitals:   11/30/17 0930 11/30/17 0934  BP:    Pulse: 95 100  Resp:    Temp:    SpO2: 97% 96%    History of present illness:  Raymond W Wrightis a 27 y.o.malewith medical history significant ofasthma, tobacco abuse, marijuana abuse, who presents with cough, shortness of breath, wheezing and chest pain.  Patient states that he he has been coughing for whole day, and he started having shortness of breath at about 5 PM, which has been progressively getting worse. He coughs up clear mucus. Denies fever or chills. Pt hassevere respiratory distress, in tripod position initially, could not speak in full sentence. Patient also reports pleuritic chest pain, which is located in the lower chest, constant, sharp, nonradiating, moderate.No recent long distance traveling. Denies tenderness in in calf areas. Patient has nausea, no vomiting, diarrhea or abdominal pain. No symptoms of UTI or unilateral weakness. Patient received 1 dose of Solu-Medrol and 2 g of magnesium sulfate with a slight improvem   Hospital Course:  Active Problems:   Acute asthma exacerbation   Tobacco abuse   Hypokalemia   Asthma exacerbation   Acute respiratory failure with hypoxia (HCC)  Acute respiratory failure with hypoxiadue to acute asthma  exacerbation:Patient is acute respiratory distress most likely due to asthma exacerbation given diffuse wheezing on lung auscultation. Chest x-ray did not show infiltration, but showed bronchitis continue. Patient also reports pleuritic chest pain, aggravated by deep breath. Was rule out for PE.  -was onSDUfor obs -Nebulizers: scheduled Atrovent and prn Xopenex Nebs -Solu-Medrol 60 mg IV tid -Z pak -Mucinex for cough  --Nasal cannula oxygen as neededto maintain O2 saturation 92% or greater he was able to maintain his oxygen.  Ambulatory O2 sat saturation evaluation was normal so he was  discharged without oxygen - CTA >negative for PE    Tobacco abuse: -Nicotine patch also education on smoking cessation was done  Hypokalemia: K=2.9on admission.  Resolved - Repletedwith KCl 60 mEq -received 2g of magnesium sulfate which is also for asthma  Leukocytosis patient is on Zithromax may be due to IV steroids recheck in the morning  Positive rhinovirus/enterovirus.  Patient was on droplet isolation .consulted with infectious disease Dr. Cristal Deer he did not think that it was of any consequence as long as he is doing well, he could be discharged home without any sort of precaution  Procedures:  None  Consultations:  Curbside infectious disease  Discharge Exam: BP 125/81 (BP Location: Right Arm)   Pulse 100   Temp 98.1 F (36.7 C) (Oral)   Resp 18   Ht 5\' 8"  (1.727 m)   Wt 63.5 kg   SpO2 96% Comment: Walking without O2  BMI 21.29 kg/m   General:Not in acute distress HEENT: Eyes: PERRL, EOMI, no scleral icterus. ENT: No discharge from the ears and nose, no pharynx injection, no tonsillar enlargement.  Neck:No JVD, no bruit, no mass felt. Heme:No neck lymph node enlargement. Cardiac:S1/S2, RRR, No murmurs, No gallops or rubs. Respiratory: CTA bilaterally no wheezing ZO:XWRU, nondistended, nontender, no rebound pain, no organomegaly,  BS present. GU: No hematuria Ext:No pitting leg edema bilaterally. 2+DP/PT pulse bilaterally. Musculoskeletal:No joint deformities, No joint redness or warmth,no limitation of ROMin spin. Skin: No rashes.  Neuro: Alert, oriented X3, cranial nerves II-XII grossly intact, moves all extremities normally.  Psych:Patient is not psychotic, no suicidal or hemocidal ideation Discharge Instructions You were cared for by a hospitalist during your hospital stay. If you have any questions about your discharge medications or the care you received while you were in the hospital after you are discharged, you can call the unit and asked to speak with the hospitalist on call if the hospitalist that took care of you is not available. Once you are discharged, your primary care physician will handle any further medical issues. Please note that NO REFILLS for any discharge medications will be authorized once you are discharged, as it is imperative that you return to your primary care physician (or establish a relationship with a primary care physician if you do not have one) for your aftercare needs so that they can reassess your need for medications and monitor your lab values.  Discharge Instructions    Call MD for:  difficulty breathing, headache or visual disturbances   Complete by:  As directed    Call MD for:  temperature >100.4   Complete by:  As directed    Diet - low sodium heart healthy   Complete by:  As directed    Discharge instructions   Complete by:  As directed    Avoid tobacco use.  Follow-up with primary care doctor in 1 to 2 weeks   Increase activity slowly   Complete by:  As directed    Nursing communication   Complete by:  As directed    Tobacco cessation information     Allergies as of 11/30/2017   No Known Allergies     Medication List    TAKE these medications   albuterol 108 (90 Base) MCG/ACT inhaler Commonly known as:  PROVENTIL HFA;VENTOLIN HFA Inhale 2 puffs into the  lungs every 4 (four) hours as needed for wheezing or shortness of breath.   azithromycin 250 MG tablet Commonly known as:  ZITHROMAX Take 1 tablet (250 mg total) by mouth daily for 7 days.   budesonide-formoterol 160-4.5 MCG/ACT inhaler Commonly known as:  SYMBICORT Inhale 2 puffs into the lungs 2 (two) times daily.   dextromethorphan-guaiFENesin 30-600 MG 12hr tablet Commonly known as:  MUCINEX DM Take 1 tablet by mouth 2 (two) times daily as needed for up to 10 days for cough.   nicotine 21 mg/24hr patch Commonly known as:  NICODERM CQ - dosed in mg/24 hours Place 1 patch (21 mg total) onto the skin daily.   predniSONE 20 MG tablet Commonly known as:  DELTASONE 40mg  daily x1 day then 20mg  daily x 1day then 10mg  daily  x1 day then stop, do not start symbicort while on prednisone taper      No Known Allergies    The results of significant diagnostics from this hospitalization (including imaging, microbiology, ancillary and laboratory) are listed below for reference.    Significant Diagnostic Studies: Dg Chest 2 View  Result Date: 11/28/2017 CLINICAL DATA:  Cough. EXAM: CHEST - 2 VIEW COMPARISON:  07/13/2013 FINDINGS: The lungs are hyperinflated with central bronchial thickening.The cardiomediastinal  contours are normal. Pulmonary vasculature is normal. No consolidation, pleural effusion, or pneumothorax. No acute osseous abnormalities are seen. IMPRESSION: Hyperinflation and central bronchial thickening suggesting asthma or bronchitis. Electronically Signed   By: Rubye Oaks M.D.   On: 11/28/2017 01:13   Ct Angio Chest Pe W Or Wo Contrast  Result Date: 11/28/2017 CLINICAL DATA:  PE suspected, high pretest prob. Shortness of breath and wheezing. EXAM: CT ANGIOGRAPHY CHEST WITH CONTRAST TECHNIQUE: Multidetector CT imaging of the chest was performed using the standard protocol during bolus administration of intravenous contrast. Multiplanar CT image reconstructions and MIPs  were obtained to evaluate the vascular anatomy. CONTRAST:  50mL ISOVUE-300 IOPAMIDOL (ISOVUE-300) INJECTION 61% COMPARISON:  Radiographs earlier this day. FINDINGS: Cardiovascular: There are no filling defects within the pulmonary arteries to suggest pulmonary embolus. Thoracic aorta is normal in caliber without dissection. Heart size normal. No pericardial effusion. Mediastinum/Nodes: No adenopathy. Visualized thyroid gland is normal. Esophagus is decompressed. Lungs/Pleura: Hyperinflation. Prominent central bronchial thickening. Areas of bronchial occlusion/mucous plugging throughout, most prominent in the lower lobes. No parenchymal airspace disease. No pulmonary edema or pleural effusion. Upper Abdomen: No acute abnormality. Musculoskeletal: Negative. Review of the MIP images confirms the above findings. IMPRESSION: 1. No pulmonary embolus. 2. Prominent central bronchial thickening with areas of lower lobe predominant mucous plugging and bronchial occlusion, likely representing asthma. Electronically Signed   By: Rubye Oaks M.D.   On: 11/28/2017 05:31    Microbiology: Recent Results (from the past 240 hour(s))  Culture, blood (Routine X 2) w Reflex to ID Panel     Status: None (Preliminary result)   Collection Time: 11/28/17  4:08 AM  Result Value Ref Range Status   Specimen Description BLOOD RIGHT ANTECUBITAL  Final   Special Requests   Final    BOTTLES DRAWN AEROBIC AND ANAEROBIC Blood Culture adequate volume   Culture   Final    NO GROWTH 1 DAY Performed at [facility], [address], [place], [place] [address]    Report Status PENDING  Incomplete  Culture, blood (Routine X 2) w Reflex to ID Panel     Status: None (Preliminary result)   Collection Time: 11/28/17  4:45 AM  Result Value Ref Range Status   Specimen Description BLOOD RIGHT FOREARM  Final   Special Requests   Final    BOTTLES DRAWN AEROBIC AND ANAEROBIC Blood Culture adequate volume   Culture   Final    NO  GROWTH 1 DAY Performed at [facility], [address] [place], [place] [address]    Report Status PENDING  Incomplete  Respiratory Panel by PCR     Status: Abnormal   Collection Time: 11/28/17  2:28 PM  Result Value Ref Range Status   Adenovirus NOT DETECTED NOT DETECTED Final   Coronavirus 229E NOT DETECTED NOT DETECTED Final   Coronavirus HKU1 NOT DETECTED NOT DETECTED Final   Coronavirus NL63 NOT DETECTED NOT DETECTED Final   Coronavirus OC43 NOT DETECTED NOT DETECTED Final   Metapneumovirus NOT DETECTED NOT DETECTED Final   Rhinovirus / Enterovirus DETECTED (A) NOT DETECTED Final   Influenza A NOT DETECTED NOT DETECTED Final   Influenza B NOT DETECTED NOT DETECTED Final   Parainfluenza Virus 1 NOT DETECTED NOT DETECTED Final   Parainfluenza Virus 2 NOT DETECTED NOT DETECTED Final   Parainfluenza Virus 3 NOT DETECTED NOT DETECTED Final   Parainfluenza Virus 4 NOT DETECTED NOT DETECTED Final   Respiratory Syncytial Virus NOT DETECTED NOT  DETECTED Final   Bordetella pertussis NOT DETECTED NOT DETECTED Final   Chlamydophila pneumoniae NOT DETECTED NOT DETECTED Final   Mycoplasma pneumoniae NOT DETECTED NOT DETECTED Final    Comment: Performed at [facility], [address] [place], [place] [address]  MRSA PCR Screening     Status: None   Collection Time: 11/29/17  1:53 AM  Result Value Ref Range Status   MRSA by PCR NEGATIVE NEGATIVE Final    Comment:        The GeneXpert MRSA Assay (FDA approved for NASAL specimens only), is one component of a comprehensive MRSA colonization surveillance program. It is not intended to diagnose MRSA infection nor to guide or monitor treatment for MRSA infections. Performed at [facility], [address] [place], [place] [address]   Expectorated sputum assessment w rflx to resp cult     Status: None   Collection Time: 11/29/17 12:06 PM  Result Value Ref Range Status   Specimen Description SPUTUM  Final   Special  Requests NONE  Final   Sputum evaluation   Final    THIS SPECIMEN IS ACCEPTABLE FOR SPUTUM CULTURE Performed at [facility], [address] [place], [place] [address]    Report Status 11/29/2017 FINAL  Final  Culture, respiratory     Status: None (Preliminary result)   Collection Time: 11/29/17 12:06 PM  Result Value Ref Range Status   Specimen Description SPUTUM  Final   Special Requests NONE Reflexed from [id number]  Final   Gram Stain   Final    FEW WBC PRESENT,BOTH PMN AND MONONUCLEAR RARE GRAM POSITIVE COCCI IN PAIRS RARE GRAM VARIABLE ROD Performed at [facility], [address] [place], [place] [address]    Culture PENDING  Incomplete   Report Status PENDING  Incomplete     Labs: Basic Metabolic Panel: Recent Labs  Lab 11/28/17 0249 11/29/17 0417 11/30/17 0511  NA 142 141 140  K 2.9* 4.2 3.8  CL 102 107 110  CO2  --  24 25  GLUCOSE 136* 92 103*  BUN 11 12 15   CREATININE 0.80 0.80 0.89  CALCIUM  --  9.2 8.4*  MG  --  2.1  --    Liver Function Tests: Recent Labs  Lab 11/30/17 0511  AST 16  ALT 18  ALKPHOS 56  BILITOT 0.3  PROT 6.1*  ALBUMIN 3.3*   No results for input(s): LIPASE, AMYLASE in the last 168 hours. No results for input(s): AMMONIA in the last 168 hours. CBC: Recent Labs  Lab 11/28/17 0159 11/28/17 0249 11/29/17 0417 11/30/17 0511  WBC 16.7*  --  17.7* 14.8*  NEUTROABS  --   --  16.0* 13.2*  HGB 14.8 16.0 14.2 13.7  HCT 44.5 47.0 41.5 40.5  MCV 96.5  --  95.6 95.5  PLT 243  --  230 239   Cardiac Enzymes: No results for input(s): CKTOTAL, CKMB, CKMBINDEX, TROPONINI in the last 168 hours. BNP: BNP (last 3 results) No results for input(s): BNP in the last 8760 hours.  ProBNP (last 3 results) No results for input(s): PROBNP in the last 8760 hours.  CBG: No results for input(s): GLUCAP in the last 168 hours.     Signed:  Myrtie NeitherNwannadiya Tyesha Joffe, MD [facility] 11/30/2017, 11:35 AM

## 2017-12-02 LAB — CULTURE, RESPIRATORY W GRAM STAIN: Culture: NORMAL

## 2017-12-02 LAB — CULTURE, RESPIRATORY

## 2017-12-03 LAB — CULTURE, BLOOD (ROUTINE X 2)
CULTURE: NO GROWTH
Culture: NO GROWTH
SPECIAL REQUESTS: ADEQUATE
Special Requests: ADEQUATE

## 2017-12-18 ENCOUNTER — Inpatient Hospital Stay: Payer: Self-pay | Admitting: Family Medicine

## 2020-11-07 ENCOUNTER — Emergency Department (HOSPITAL_COMMUNITY)
Admission: EM | Admit: 2020-11-07 | Discharge: 2020-11-07 | Disposition: A | Discharge status: Home / Self Care | Payer: Self-pay | Attending: Emergency Medicine | Admitting: Emergency Medicine

## 2020-11-07 ENCOUNTER — Emergency Department (HOSPITAL_COMMUNITY): Payer: Self-pay

## 2020-11-07 ENCOUNTER — Other Ambulatory Visit: Payer: Self-pay

## 2020-11-07 DIAGNOSIS — Z20822 Contact with and (suspected) exposure to covid-19: Secondary | ICD-10-CM | POA: Insufficient documentation

## 2020-11-07 DIAGNOSIS — J45901 Unspecified asthma with (acute) exacerbation: Secondary | ICD-10-CM | POA: Insufficient documentation

## 2020-11-07 DIAGNOSIS — F17210 Nicotine dependence, cigarettes, uncomplicated: Secondary | ICD-10-CM | POA: Insufficient documentation

## 2020-11-07 DIAGNOSIS — Z7951 Long term (current) use of inhaled steroids: Secondary | ICD-10-CM | POA: Insufficient documentation

## 2020-11-07 LAB — CBC WITH DIFFERENTIAL/PLATELET
Abs Immature Granulocytes: 0.04 10*3/uL (ref 0.00–0.07)
Basophils Absolute: 0.1 10*3/uL (ref 0.0–0.1)
Basophils Relative: 0 %
Eosinophils Absolute: 0.3 10*3/uL (ref 0.0–0.5)
Eosinophils Relative: 2 %
HCT: 42.1 % (ref 39.0–52.0)
Hemoglobin: 14.5 g/dL (ref 13.0–17.0)
Immature Granulocytes: 0 %
Lymphocytes Relative: 24 %
Lymphs Abs: 3.4 10*3/uL (ref 0.7–4.0)
MCH: 33 pg (ref 26.0–34.0)
MCHC: 34.4 g/dL (ref 30.0–36.0)
MCV: 95.9 fL (ref 80.0–100.0)
Monocytes Absolute: 1.2 10*3/uL — ABNORMAL HIGH (ref 0.1–1.0)
Monocytes Relative: 8 %
Neutro Abs: 8.9 10*3/uL — ABNORMAL HIGH (ref 1.7–7.7)
Neutrophils Relative %: 66 %
Platelets: 354 10*3/uL (ref 150–400)
RBC: 4.39 MIL/uL (ref 4.22–5.81)
RDW: 14.1 % (ref 11.5–15.5)
WBC: 13.9 10*3/uL — ABNORMAL HIGH (ref 4.0–10.5)
nRBC: 0 % (ref 0.0–0.2)

## 2020-11-07 LAB — BASIC METABOLIC PANEL
Anion gap: 11 (ref 5–15)
BUN: 9 mg/dL (ref 6–20)
CO2: 28 mmol/L (ref 22–32)
Calcium: 9.5 mg/dL (ref 8.9–10.3)
Chloride: 102 mmol/L (ref 98–111)
Creatinine, Ser: 0.91 mg/dL (ref 0.61–1.24)
GFR, Estimated: >60 mL/min (ref >60)
Glucose, Bld: 104 mg/dL — ABNORMAL HIGH (ref 70–99)
Potassium: 3.2 mmol/L — ABNORMAL LOW (ref 3.5–5.1)
Sodium: 141 mmol/L (ref 135–145)

## 2020-11-07 LAB — RESP PANEL BY RT-PCR (FLU A&B, COVID) ARPGX2
Influenza A by PCR: NEGATIVE
Influenza B by PCR: NEGATIVE
SARS Coronavirus 2 by RT PCR: NEGATIVE

## 2020-11-07 MED ORDER — ALBUTEROL SULFATE HFA 108 (90 BASE) MCG/ACT IN AERS: 2.0000 | INHALATION_SPRAY | RESPIRATORY_TRACT | 0 refills | Status: DC | PRN

## 2020-11-07 MED ORDER — PREDNISONE 20 MG PO TABS: 40.0000 mg | ORAL_TABLET | Freq: Every day | ORAL | 0 refills | Status: AC

## 2020-11-07 MED ORDER — ALBUTEROL (5 MG/ML) CONTINUOUS INHALATION SOLN
10.0000 mg/h | INHALATION_SOLUTION | RESPIRATORY_TRACT | Status: DC
  Administered 2020-11-07: 10 mg/h via RESPIRATORY_TRACT
  Filled 2020-11-07: qty 20

## 2020-11-07 NOTE — Discharge Instructions (Signed)
Use albuterol as instructed as needed.  Recommend course of steroids.  Please follow-up with your primary doctor.  If you do not have 1, call the number provided to request follow-up appointment.  If you develop difficulty breathing, chest pain, or other new concerning symptom, come back to ER for reassessment.

## 2020-11-07 NOTE — ED Provider Notes (Signed)
[facility] EMERGENCY DEPARTMENT Provider Note   CSN: [id number] Arrival date & time: 11/07/20  2025     History Chief Complaint  Patient presents with   Respiratory Distress    Raymond Neal is a 30 y.o. male.  Presented to ER with concern for respiratory distress.  Patient states that he has a history of asthma.  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  Felt similar to prior asthma flares.  Difficulty breathing became progressive throughout the day.  Felt like his chest was very tight but denied any chest pain.  Symptoms improved after receiving meds from EMS but still had some ongoing wheezing.  He denies any recent fevers.  No leg pain or leg swelling.  Denies prior history of DVT/PE.  HPI     Past Medical History:  Diagnosis Date   Asthma     Patient Active Problem List   Diagnosis Date Noted   Tobacco abuse 11/28/2017   Hypokalemia 11/28/2017   Asthma exacerbation 11/28/2017   Acute respiratory failure with hypoxia (HCC) 11/28/2017   Acute bronchitis 07/14/2013   Acute asthma exacerbation 07/13/2013   Tachycardia 07/13/2013   Asthma 02/10/2011    Past Surgical History:  Procedure Laterality Date   CLAVICLE SURGERY Right 2014       Family History  Problem Relation Age of Onset   Hypertension Father     Social History   Tobacco Use   Smoking status: Every Day    Packs/day: 0.50    Years: 0.00    Pack years: 0.00    Types: Cigarettes   Smokeless tobacco: Never  Substance Use Topics   Alcohol use: No   Drug use: Yes    Frequency: 1.0 times per week    Types: Marijuana    Home Medications Prior to Admission medications   Medication Sig Start Date End Date Taking? Authorizing Provider  albuterol (VENTOLIN HFA) 108 (90 Base) MCG/ACT inhaler Inhale 2 puffs into the lungs every 4 (four) hours as needed for wheezing or shortness of breath.  11/07/20   Milagros Loll, MD  budesonide-formoterol [facility]) 160-4.5 MCG/ACT inhaler Inhale 2 puffs into the lungs 2 (two) times daily. 11/30/17   Myrtie Neither, MD  nicotine (NICODERM CQ - DOSED IN MG/24 HOURS) 21 mg/24hr patch Place 1 patch (21 mg total) onto the skin daily. 11/30/17   Myrtie Neither, MD  predniSONE (DELTASONE) 20 MG tablet Take 2 tablets (40 mg total) by mouth daily with breakfast for 4 days. 11/07/20 11/11/20  Milagros Loll, MD    Allergies    Patient has no known allergies.  Review of Systems   Review of Systems  Constitutional:  Negative for chills and fever.  HENT:  Negative for ear pain and sore throat.   Eyes:  Negative for pain and visual disturbance.  Respiratory:  Positive for cough and shortness of breath.   Cardiovascular:  Negative for chest pain and palpitations.  Gastrointestinal:  Negative for abdominal pain and vomiting.  Genitourinary:  Negative for dysuria and hematuria.  Musculoskeletal:  Negative for arthralgias and back pain.  Skin:  Negative for color change and rash.  Neurological:  Negative for seizures and syncope.  All other systems reviewed and are negative.  Physical Exam Updated Vital Signs BP 119/73   Pulse (!) 104   Temp 98.6 F (37 C)   Resp 14   SpO2  95%   Physical Exam Vitals and nursing note reviewed.  Constitutional:      Appearance: He is well-developed.  HENT:     Head: Normocephalic and atraumatic.  Eyes:     Conjunctiva/sclera: Conjunctivae normal.  Cardiovascular:     Rate and Rhythm: Normal rate and regular rhythm.     Heart sounds: No murmur heard. Pulmonary:     Effort: Pulmonary effort is normal. No respiratory distress.     Comments: Bilateral inspiratory and expiratory wheezing appreciated, some tachypnea, speaks in full sentences Abdominal:     Palpations: Abdomen is soft.     Tenderness: There is no abdominal tenderness.  Musculoskeletal:        General: No deformity or signs of injury.      Cervical back: Neck supple.  Skin:    General: Skin is warm and dry.  Neurological:     General: No focal deficit present.     Mental Status: He is alert.  Psychiatric:        Mood and Affect: Mood normal.        Behavior: Behavior normal.    ED Results / Procedures / Treatments   Labs (all labs ordered are listed, but only abnormal results are displayed) Labs Reviewed  CBC WITH DIFFERENTIAL/PLATELET - Abnormal; Notable for the following components:      Result Value   WBC 13.9 (*)    Neutro Abs 8.9 (*)    Monocytes Absolute 1.2 (*)    All other components within normal limits  BASIC METABOLIC PANEL - Abnormal; Notable for the following components:   Potassium 3.2 (*)    Glucose, Bld 104 (*)    All other components within normal limits  RESP PANEL BY RT-PCR (FLU A&B, COVID) [id number]    EKG EKG Interpretation  Date/Time:  Tuesday November 07 2020 21:44:49 EDT Ventricular Rate:  110 PR Interval:  175 QRS Duration: 85 QT Interval:  352 QTC Calculation: 477 R Axis:   77 Text Interpretation: Sinus tachycardia Borderline prolonged QT interval Baseline wander in lead(s) V4 When compared with ECG of 11/28/2017, No significant change was found Confirmed by Dione Booze ([id number]) on 11/07/2020 11:12:57 PM  Radiology DG Chest Portable 1 View  Result Date: 11/07/2020 CLINICAL DATA:  Respiratory distress EXAM: PORTABLE CHEST 1 VIEW COMPARISON:  11/28/2017 FINDINGS: The heart size and mediastinal contours are within normal limits. Both lungs are clear. The visualized skeletal structures are unremarkable. IMPRESSION: No active disease. Electronically Signed   By: Deatra Robinson M.D.   On: 11/07/2020 21:30    Procedures Procedures   Medications Ordered in ED Medications - No data to display   ED Course  I have reviewed the triage vital signs and the nursing notes.  Pertinent labs & imaging results that were available during my care of the patient were reviewed by me and considered in my  medical decision making (see chart for details).    MDM Rules/Calculators/A&P                           30 year old male with history of asthma presenting to ER with concern for asthma exacerbation.  On exam patient noted to have significant expiratory and inspiratory wheezing.  He did feel markedly improved after receiving the albuterol, Atrovent and Solu-Medrol and magnesium from EMS.  Due to the ongoing wheezing, provided additional hour-long continuous albuterol treatment.  On subsequent reassessment, patient had no ongoing difficulty breathing, his lungs  were remarkably clear.  Chest x-ray negative.  Basic labs stable.  No hypoxia.  Given his current clinical appearance, believe he is appropriate for outpatient management.  Reviewed return precautions, provided course of steroids and refill on albuterol inhaler.  After the discussed management above, the patient was determined to be safe for discharge.  The patient was in agreement with this plan and all questions regarding their care were answered.  ED return precautions were discussed and the patient will return to the ED with any significant worsening of condition.  Final Clinical Impression(s) / ED Diagnoses Final diagnoses:  Exacerbation of asthma, unspecified asthma severity, unspecified whether persistent    Rx / DC Orders ED Discharge Orders          Ordered    albuterol (VENTOLIN HFA) 108 (90 Base) MCG/ACT inhaler  Every 4 hours PRN        11/07/20 2301    predniSONE (DELTASONE) 20 MG tablet  Daily with breakfast        11/07/20 2301             Milagros Loll, MD 11/08/20 1525

## 2020-11-07 NOTE — ED Triage Notes (Signed)
BIB [facility] from home. Hx: Asthma. Was working outside. Using inhaler not helping. SOB all day but got worse this evening. 93% on room air but was very tight.  EMS gave 10 albuterol,  0.5 Atrovent. 125 solumedrol, 2g Mag.   100% 8L on neb  137/89 120 heart rate  18 L AC

## 2020-12-29 ENCOUNTER — Emergency Department (HOSPITAL_COMMUNITY): Payer: Self-pay

## 2020-12-29 ENCOUNTER — Emergency Department (HOSPITAL_COMMUNITY)
Admission: EM | Admit: 2020-12-29 | Discharge: 2020-12-29 | Disposition: A | Discharge status: Home / Self Care | Payer: Self-pay | Attending: Emergency Medicine | Admitting: Emergency Medicine

## 2020-12-29 DIAGNOSIS — F17210 Nicotine dependence, cigarettes, uncomplicated: Secondary | ICD-10-CM | POA: Insufficient documentation

## 2020-12-29 DIAGNOSIS — R0603 Acute respiratory distress: Secondary | ICD-10-CM | POA: Insufficient documentation

## 2020-12-29 DIAGNOSIS — R0600 Dyspnea, unspecified: Secondary | ICD-10-CM | POA: Insufficient documentation

## 2020-12-29 DIAGNOSIS — R000 Tachycardia, unspecified: Secondary | ICD-10-CM | POA: Insufficient documentation

## 2020-12-29 DIAGNOSIS — J45909 Unspecified asthma, uncomplicated: Secondary | ICD-10-CM | POA: Insufficient documentation

## 2020-12-29 DIAGNOSIS — Z7951 Long term (current) use of inhaled steroids: Secondary | ICD-10-CM | POA: Insufficient documentation

## 2020-12-29 DIAGNOSIS — R0902 Hypoxemia: Secondary | ICD-10-CM | POA: Insufficient documentation

## 2020-12-29 LAB — COMPREHENSIVE METABOLIC PANEL
ALT: 16 U/L (ref 0–44)
AST: 18 U/L (ref 15–41)
Albumin: 4.2 g/dL (ref 3.5–5.0)
Alkaline Phosphatase: 66 U/L (ref 38–126)
Anion gap: 10 (ref 5–15)
BUN: 10 mg/dL (ref 6–20)
CO2: 26 mmol/L (ref 22–32)
Calcium: 9 mg/dL (ref 8.9–10.3)
Chloride: 104 mmol/L (ref 98–111)
Creatinine, Ser: 0.95 mg/dL (ref 0.61–1.24)
GFR, Estimated: >60 mL/min (ref >60)
Glucose, Bld: 102 mg/dL — ABNORMAL HIGH (ref 70–99)
Potassium: 3.4 mmol/L — ABNORMAL LOW (ref 3.5–5.1)
Sodium: 140 mmol/L (ref 135–145)
Total Bilirubin: 0.6 mg/dL (ref 0.3–1.2)
Total Protein: 7.2 g/dL (ref 6.5–8.1)

## 2020-12-29 LAB — CBC WITH DIFFERENTIAL/PLATELET
Abs Immature Granulocytes: 0.03 10*3/uL (ref 0.00–0.07)
Basophils Absolute: 0 10*3/uL (ref 0.0–0.1)
Basophils Relative: 0 %
Eosinophils Absolute: 0.5 10*3/uL (ref 0.0–0.5)
Eosinophils Relative: 4 %
HCT: 45.4 % (ref 39.0–52.0)
Hemoglobin: 15.1 g/dL (ref 13.0–17.0)
Immature Granulocytes: 0 %
Lymphocytes Relative: 19 %
Lymphs Abs: 2.3 10*3/uL (ref 0.7–4.0)
MCH: 33.6 pg (ref 26.0–34.0)
MCHC: 33.3 g/dL (ref 30.0–36.0)
MCV: 100.9 fL — ABNORMAL HIGH (ref 80.0–100.0)
Monocytes Absolute: 1 10*3/uL (ref 0.1–1.0)
Monocytes Relative: 8 %
Neutro Abs: 8.3 10*3/uL — ABNORMAL HIGH (ref 1.7–7.7)
Neutrophils Relative %: 69 %
Platelets: 253 10*3/uL (ref 150–400)
RBC: 4.5 MIL/uL (ref 4.22–5.81)
RDW: 13.8 % (ref 11.5–15.5)
WBC: 12.1 10*3/uL — ABNORMAL HIGH (ref 4.0–10.5)
nRBC: 0 % (ref 0.0–0.2)

## 2020-12-29 MED ORDER — IPRATROPIUM-ALBUTEROL 0.5-2.5 (3) MG/3ML IN SOLN
3.0000 mL | Freq: Once | RESPIRATORY_TRACT | Status: AC
  Administered 2020-12-29: 3 mL via RESPIRATORY_TRACT
  Filled 2020-12-29: qty 3

## 2020-12-29 MED ORDER — SODIUM CHLORIDE 0.9 % IV BOLUS
1000.0000 mL | Freq: Once | INTRAVENOUS | Status: AC
  Administered 2020-12-29: 1000 mL via INTRAVENOUS

## 2020-12-29 MED ORDER — ALBUTEROL SULFATE HFA 108 (90 BASE) MCG/ACT IN AERS: 2.0000 | INHALATION_SPRAY | RESPIRATORY_TRACT | 0 refills | Status: AC | PRN

## 2020-12-29 MED ORDER — PREDNISONE 20 MG PO TABS: 40.0000 mg | ORAL_TABLET | Freq: Every day | ORAL | 0 refills | Status: AC

## 2020-12-29 MED ORDER — ALBUTEROL SULFATE (2.5 MG/3ML) 0.083% IN NEBU
10.0000 mg/h | INHALATION_SOLUTION | Freq: Once | RESPIRATORY_TRACT | Status: AC
  Administered 2020-12-29: 10 mg/h via RESPIRATORY_TRACT
  Filled 2020-12-29: qty 3

## 2020-12-29 MED ORDER — ALBUTEROL SULFATE (2.5 MG/3ML) 0.083% IN NEBU: 2.5000 mg | INHALATION_SOLUTION | Freq: Four times a day (QID) | RESPIRATORY_TRACT | 12 refills | Status: AC | PRN

## 2020-12-29 MED ORDER — NICOTINE 21 MG/24HR TD PT24: 21.0000 mg | MEDICATED_PATCH | Freq: Every day | TRANSDERMAL | 0 refills | Status: AC

## 2020-12-29 NOTE — ED Triage Notes (Signed)
Pt arrived via [facility] from home. Per EMS, pt called with c/o SOB with hx of asthma and no relief with rescue inhaler. EMS further reports pt was in obvious distress on their arrival with tachypnea, diaphoresis, tripod positioning, and wheezing. Pt was treated with duo neb tx x2 and placed on CPAP which brought his initial SpO2 of 80% maintained WNL.  Pt arrived to ED caox4, warm and dry , c/o "a little" SOB but states much better than when SOB started.

## 2020-12-29 NOTE — Discharge Instructions (Signed)
As discussed, your evaluation today has been largely reassuring.  But, it is important that you monitor your condition carefully, and do not hesitate to return to the ED if you develop new, or concerning changes in your condition.   Please use albuterol every 4 hours.  You may then use it as needed.  In addition, please obtain your prescribed steroids.

## 2020-12-29 NOTE — ED Provider Notes (Signed)
[facility] EMERGENCY DEPARTMENT Provider Note   CSN: [id number] Arrival date & time: 12/29/20  1824     History Chief Complaint  Patient presents with   Shortness of Breath    Raymond Neal is a 30 y.o. male.  HPI Patient presents via EMS with CPAP in place.  After transitioning Monitoring requirement, and being able to tolerate reduction in supplemental oxygen patient can provide details of his history himself.  Additional details provided by EMS on arrival. Patient has a history of asthma, continues to smoke cigarettes.  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 Today the patient was dyspneic, tachypneic, did not improve with rescue inhalers and called EMS.  EMS reports that on arrival patient was tachycardic, hypoxic to 80%.  Patient received Solu-Medrol, magnesium, Atrovent, albuterol, CPAP, improved substantially in route.    Past Medical History:  Diagnosis Date   Asthma     Patient Active Problem List   Diagnosis Date Noted   Tobacco abuse 11/28/2017   Hypokalemia 11/28/2017   Asthma exacerbation 11/28/2017   Acute respiratory failure with hypoxia (HCC) 11/28/2017   Acute bronchitis 07/14/2013   Acute asthma exacerbation 07/13/2013   Tachycardia 07/13/2013   Asthma 02/10/2011    Past Surgical History:  Procedure Laterality Date   CLAVICLE SURGERY Right 2014       Family History  Problem Relation Age of Onset   Hypertension Father     Social History   Tobacco Use   Smoking status: Every Day    Packs/day: 0.50    Years: 0.00    Pack years: 0.00    Types: Cigarettes   Smokeless tobacco: Never  Substance Use Topics   Alcohol use: No   Drug use: Yes    Frequency: 1.0 times per week    Types: Marijuana    Home Medications Prior to Admission medications   Medication Sig Start Date End Date Taking? Authorizing Provider  albuterol (VENTOLIN HFA) 108 (90  Base) MCG/ACT inhaler Inhale 2 puffs into the lungs every 4 (four) hours as needed for wheezing or shortness of breath. 11/07/20   Milagros Loll, MD  budesonide-formoterol [facility]) 160-4.5 MCG/ACT inhaler Inhale 2 puffs into the lungs 2 (two) times daily. 11/30/17   Myrtie Neither, MD  nicotine (NICODERM CQ - DOSED IN MG/24 HOURS) 21 mg/24hr patch Place 1 patch (21 mg total) onto the skin daily. 11/30/17   Myrtie Neither, MD    Allergies    Patient has no known allergies.  Review of Systems   Review of Systems  Unable to perform ROS: Acuity of condition   Physical Exam Updated Vital Signs BP 134/83 (BP Location: Right Arm)   Pulse (!) 114   Temp 98.1 F (36.7 C) (Oral)   Resp 12   SpO2 97%   Physical Exam Vitals and nursing note reviewed.  Constitutional:      Appearance: He is well-developed. He is ill-appearing and diaphoretic.  HENT:     Head: Normocephalic and atraumatic.  Eyes:     Conjunctiva/sclera: Conjunctivae normal.  Cardiovascular:     Rate and Rhythm: Normal rate and regular rhythm.  Pulmonary:     Effort: Tachypnea and accessory muscle usage present.     Breath sounds: Decreased breath sounds and wheezing present.  Abdominal:     General: There is no distension.  Musculoskeletal:     Right lower leg: No edema.  Left lower leg: No edema.  Skin:    General: Skin is warm.  Neurological:     Mental Status: He is alert and oriented to person, place, and time.    ED Results / Procedures / Treatments   Labs (all labs ordered are listed, but only abnormal results are displayed) Labs Reviewed  COMPREHENSIVE METABOLIC PANEL  CBC WITH DIFFERENTIAL/PLATELET    EKG EKG Interpretation  Date/Time:  Friday December 29 2020 18:30:00 EDT Ventricular Rate:  119 PR Interval:  163 QRS Duration: 71 QT Interval:  311 QTC Calculation: 438 R Axis:   86 Text Interpretation: Sinus tachycardia Probable anteroseptal infarct, old Abnormal ECG Confirmed by  Gerhard Munch [phone]) on 12/29/2020 7:26:57 PM  Radiology DG Chest Port 1 View  Result Date: 12/29/2020 CLINICAL DATA:  Shortness of breath EXAM: PORTABLE CHEST 1 VIEW COMPARISON:  Chest x-ray 11/07/2020, CT chest 11/28/2017 FINDINGS: The heart and mediastinal contours are unchanged. No focal consolidation. No pulmonary edema. No pleural effusion. No pneumothorax. No acute osseous abnormality. IMPRESSION: No active disease. Electronically Signed   By: Tish Frederickson M.D.   On: 12/29/2020 19:10    Procedures Procedures   Medications Ordered in ED Medications  albuterol (PROVENTIL) (2.5 MG/3ML) 0.083% nebulizer solution (10 mg/hr Nebulization Given 12/29/20 1837)  sodium chloride 0.9 % bolus 1,000 mL (1,000 mLs Intravenous New Bag/Given 12/29/20 1841)    ED Course  I have reviewed the triage vital signs and the nursing notes.  Pertinent labs & imaging results that were available during my care of the patient were reviewed by me and considered in my medical decision making (see chart for details).  Cardiac 120 sinus tach abnormal   7:27 PM Patient on room air, has persistent wheezing.  10:22 PM Minimal wheezing.  Patient is on room air.  I again discussed all findings with patient and his male companion.  Patient has had multiple doses of albuterol, DuoNeb, received steroids, magnesium, epinephrine prior to arrival.  Now with resolution of respiratory distress, patient is appropriate for discharge close outpatient follow-up.  Discussed wound care instructions for next 2 days, and smoking cessation as well. MDM Rules/Calculators/A&P MDM Number of Diagnoses or Management Options Respiratory distress: new, needed workup   Amount and/or Complexity of Data Reviewed Clinical lab tests: ordered and reviewed Tests in the radiology section of CPT: ordered and reviewed Tests in the medicine section of CPT: reviewed and ordered Decide to obtain previous medical records or to obtain  history from someone other than the patient: yes Obtain history from someone other than the patient: yes Review and summarize past medical records: yes Independent visualization of images, tracings, or specimens: yes  Risk of Complications, Morbidity, and/or Mortality Presenting problems: high Diagnostic procedures: high Management options: high  Critical Care Total time providing critical care: 30-74 minutes (35)  Patient Progress Patient progress: improved   Final Clinical Impression(s) / ED Diagnoses Final diagnoses:  Respiratory distress     Gerhard Munch, MD 12/29/20 2225

## 2021-02-05 ENCOUNTER — Other Ambulatory Visit: Payer: Self-pay

## 2021-02-05 ENCOUNTER — Encounter (HOSPITAL_BASED_OUTPATIENT_CLINIC_OR_DEPARTMENT_OTHER): Payer: Self-pay | Admitting: Emergency Medicine

## 2021-02-05 ENCOUNTER — Emergency Department (HOSPITAL_BASED_OUTPATIENT_CLINIC_OR_DEPARTMENT_OTHER)
Admission: EM | Admit: 2021-02-05 | Discharge: 2021-02-05 | Disposition: A | Discharge status: Home / Self Care | Payer: Self-pay | Attending: Emergency Medicine | Admitting: Emergency Medicine

## 2021-02-05 DIAGNOSIS — J45909 Unspecified asthma, uncomplicated: Secondary | ICD-10-CM | POA: Insufficient documentation

## 2021-02-05 DIAGNOSIS — F17210 Nicotine dependence, cigarettes, uncomplicated: Secondary | ICD-10-CM | POA: Insufficient documentation

## 2021-02-05 DIAGNOSIS — X58XXXA Exposure to other specified factors, initial encounter: Secondary | ICD-10-CM | POA: Insufficient documentation

## 2021-02-05 DIAGNOSIS — S0501XA Injury of conjunctiva and corneal abrasion without foreign body, right eye, initial encounter: Secondary | ICD-10-CM | POA: Insufficient documentation

## 2021-02-05 MED ORDER — TETRACAINE HCL 0.5 % OP SOLN
2.0000 [drp] | Freq: Once | OPHTHALMIC | Status: AC
  Administered 2021-02-05: 2 [drp] via OPHTHALMIC
  Filled 2021-02-05: qty 4

## 2021-02-05 MED ORDER — FLUORESCEIN SODIUM 1 MG OP STRP
1.0000 | ORAL_STRIP | Freq: Once | OPHTHALMIC | Status: AC
  Administered 2021-02-05: 1 via OPHTHALMIC
  Filled 2021-02-05: qty 1

## 2021-02-05 MED ORDER — ERYTHROMYCIN 5 MG/GM OP OINT
TOPICAL_OINTMENT | Freq: Once | OPHTHALMIC | Status: AC
  Administered 2021-02-05: 1 via OPHTHALMIC
  Filled 2021-02-05: qty 3.5

## 2021-02-05 NOTE — ED Triage Notes (Signed)
Pt via pov from home with right eye pain and swelling since this morning when he awakened. 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 Pt alert & oriented, nad noted.

## 2021-02-05 NOTE — ED Provider Notes (Signed)
[facility] EMERGENCY DEPT Provider Note   CSN: [id number] Arrival date & time: 02/05/21  1857     History Chief Complaint  Patient presents with   Eye Pain    Raymond Neal is a 30 y.o. male.  The history is provided by the patient.  Eye Pain This is a new problem. The current episode started more than 2 days ago. The problem occurs constantly. The problem has not changed since onset.Pertinent negatives include no chest pain, no abdominal pain, no headaches and no shortness of breath. Nothing aggravates the symptoms. Nothing relieves the symptoms. He has tried nothing for the symptoms.      Past Medical History:  Diagnosis Date   Asthma     Patient Active Problem List   Diagnosis Date Noted   Tobacco abuse 11/28/2017   Hypokalemia 11/28/2017   Asthma exacerbation 11/28/2017   Acute respiratory failure with hypoxia (HCC) 11/28/2017   Acute bronchitis 07/14/2013   Acute asthma exacerbation 07/13/2013   Tachycardia 07/13/2013   Asthma 02/10/2011    Past Surgical History:  Procedure Laterality Date   CLAVICLE SURGERY Right 2014       Family History  Problem Relation Age of Onset   Hypertension Father     Social History   Tobacco Use   Smoking status: Every Day    Packs/day: 0.50    Years: 0.00    Pack years: 0.00    Types: Cigarettes   Smokeless tobacco: Never  Vaping Use   Vaping Use: Never used  Substance Use Topics   Alcohol use: No   Drug use: Yes    Frequency: 1.0 times per week    Types: Marijuana    Home Medications Prior to Admission medications   Medication Sig Start Date End Date Taking? Authorizing Provider  albuterol (PROVENTIL) (2.5 MG/3ML) 0.083% nebulizer solution Take 3 mLs (2.5 mg total) by nebulization every 6 (six) hours as needed for wheezing or shortness of breath. 12/29/20   Gerhard Munch, MD  albuterol (VENTOLIN HFA) 108 (90 Base) MCG/ACT inhaler Inhale 2 puffs into the lungs every 4 (four) hours as needed  for wheezing or shortness of breath. 12/29/20   Gerhard Munch, MD  budesonide-formoterol [facility]) 160-4.5 MCG/ACT inhaler Inhale 2 puffs into the lungs 2 (two) times daily. 11/30/17   Myrtie Neither, MD  nicotine (NICODERM CQ - DOSED IN MG/24 HOURS) 21 mg/24hr patch Place 1 patch (21 mg total) onto the skin daily. 12/29/20   Gerhard Munch, MD  predniSONE (DELTASONE) 20 MG tablet Take 2 tablets (40 mg total) by mouth daily with breakfast. For the next four days 12/29/20   Gerhard Munch, MD    Allergies    Patient has no known allergies.  Review of Systems   Review of Systems  Constitutional:  Negative for fever.  HENT:  Negative for congestion.   Eyes:  Positive for pain. Negative for photophobia, itching and visual disturbance.  Respiratory:  Negative for shortness of breath.   Cardiovascular:  Negative for chest pain.  Gastrointestinal:  Negative for abdominal pain.  Genitourinary:  Negative for difficulty urinating.  Musculoskeletal:  Negative for arthralgias.  Skin:  Negative for rash.  Neurological:  Negative for headaches.  Psychiatric/Behavioral:  Negative for agitation.   All other systems reviewed and are negative.  Physical Exam Updated Vital Signs BP (!) 141/97 (BP Location: Right Arm)   Pulse 96   Temp 99 F (37.2 C) (Oral)   Resp 18   Ht 5\' 9"  (  1.753 m)   Wt 68 kg   SpO2 97%   BMI 22.15 kg/m   Physical Exam Vitals and nursing note reviewed. Exam conducted with a chaperone present.  Constitutional:      General: He is not in acute distress.    Appearance: Normal appearance.  HENT:     Head: Normocephalic and atraumatic.     Nose: Nose normal.  Eyes:     Extraocular Movements: Extraocular movements intact.     Conjunctiva/sclera: Conjunctivae normal.     Pupils: Pupils are equal, round, and reactive to light.     Comments: Corneal abrasion 6 oclock   Cardiovascular:     Rate and Rhythm: Normal rate and regular rhythm.     Pulses: Normal pulses.      Heart sounds: Normal heart sounds.  Pulmonary:     Effort: Pulmonary effort is normal.     Breath sounds: Normal breath sounds.  Abdominal:     General: Abdomen is flat. Bowel sounds are normal.     Palpations: Abdomen is soft.     Tenderness: There is no abdominal tenderness. There is no guarding.  Musculoskeletal:        General: Normal range of motion.     Cervical back: Normal range of motion and neck supple.  Skin:    General: Skin is warm and dry.     Capillary Refill: Capillary refill takes less than 2 seconds.  Neurological:     General: No focal deficit present.     Mental Status: He is alert and oriented to person, place, and time.     Deep Tendon Reflexes: Reflexes normal.  Psychiatric:        Mood and Affect: Mood normal.        Behavior: Behavior normal.    ED Results / Procedures / Treatments   Labs (all labs ordered are listed, but only abnormal results are displayed) Labs Reviewed - No data to display  EKG None  Radiology No results found.  Procedures Procedures   Medications Ordered in ED Medications  erythromycin ophthalmic ointment (has no administration in time range)  tetracaine (PONTOCAINE) 0.5 % ophthalmic solution 2 drop (2 drops Right Eye Given by Other 02/05/21 2245)  fluorescein ophthalmic strip 1 strip (1 strip Right Eye Given by Other 02/05/21 2246)    ED Course  I have reviewed the triage vital signs and the nursing notes.  Pertinent labs & imaging results that were available during my care of the patient were reviewed by me and considered in my medical decision making (see chart for details).   Emycin ointemnt and claritin and follow up with ophthalmology as an outpatient.    Raymond Neal was evaluated in Emergency Department on 02/05/2021 for the symptoms described in the history of present illness. He was evaluated in the context of the global COVID-19 pandemic, which necessitated consideration that the patient might be at risk  for infection with the SARS-CoV-2 virus that causes COVID-19. Institutional protocols and algorithms that pertain to the evaluation of patients at risk for COVID-19 are in a state of rapid change based on information released by regulatory bodies including the CDC and federal and state organizations. These policies and algorithms were followed during the patient's care in the ED.  Final Clinical Impression(s) / ED Diagnoses Final diagnoses:  None  Return for intractable cough, coughing up blood, fevers > 100.4 unrelieved by medication, shortness of breath, intractable vomiting, chest pain, shortness of breath, weakness, numbness,  changes in speech, facial asymmetry, abdominal pain, passing out, Inability to tolerate liquids or food, cough, altered mental status or any concerns. No signs of systemic illness or infection. The patient is nontoxic-appearing on exam and vital signs are within normal limits.  I have reviewed the triage vital signs and the nursing notes. Pertinent labs & imaging results that were available during my care of the patient were reviewed by me and considered in my medical decision making (see chart for details). After history, exam, and medical workup I feel the patient has been appropriately medically screened and is safe for discharge home. Pertinent diagnoses were discussed with the patient. Patient was given return precautions.   Rx / DC Orders ED Discharge Orders     None        Estefania Kamiya, MD 02/05/21 2307

## 2021-07-06 ENCOUNTER — Emergency Department (HOSPITAL_COMMUNITY)
Admission: EM | Admit: 2021-07-06 | Discharge: 2021-07-06 | Disposition: A | Discharge status: Home / Self Care | Payer: Self-pay | Attending: Emergency Medicine | Admitting: Emergency Medicine

## 2021-07-06 ENCOUNTER — Emergency Department (HOSPITAL_COMMUNITY): Payer: Self-pay

## 2021-07-06 ENCOUNTER — Other Ambulatory Visit: Payer: Self-pay

## 2021-07-06 ENCOUNTER — Encounter (HOSPITAL_COMMUNITY): Payer: Self-pay

## 2021-07-06 DIAGNOSIS — Y990 Civilian activity done for income or pay: Secondary | ICD-10-CM | POA: Insufficient documentation

## 2021-07-06 DIAGNOSIS — S0093XA Contusion of unspecified part of head, initial encounter: Secondary | ICD-10-CM | POA: Insufficient documentation

## 2021-07-06 DIAGNOSIS — W228XXA Striking against or struck by other objects, initial encounter: Secondary | ICD-10-CM | POA: Insufficient documentation

## 2021-07-06 DIAGNOSIS — R110 Nausea: Secondary | ICD-10-CM | POA: Insufficient documentation

## 2021-07-06 DIAGNOSIS — S0990XA Unspecified injury of head, initial encounter: Secondary | ICD-10-CM

## 2021-07-06 NOTE — ED Provider Notes (Signed)
?[name] ?Provider Note ? ? ?CSN: [id number] ?Arrival date & time: 07/06/21  1321 ? ?  ? ?History ? ?Chief Complaint  ?Patient presents with  ? Head Injury  ? ? ?Raymond Neal is a 31 y.o. male. ? ?Patient hit his head yesterday on a window.  Having headaches, ringing in his ear.  Not on blood thinners.  No neck pain, no extremity pain.  This happened yesterday.  He denies any numbness, tingling, weakness.  Concern for possible concussion.  Denies any other extremity pain.  Nothing has made it worse or better.  Has felt nauseous. ? ?The history is provided by the patient.  ?Head Injury ?Location:  Generalized ?Mechanism of injury: direct blow   ? ?  ? ?Home Medications ?Prior to Admission medications   ?Medication Sig Start Date End Date Taking? Authorizing Provider  ?albuterol (PROVENTIL) (2.5 MG/3ML) 0.083% nebulizer solution Take 3 mLs (2.5 mg total) by nebulization every 6 (six) hours as needed for wheezing or shortness of breath. 12/29/20   Gerhard Munch, MD  ?albuterol (VENTOLIN HFA) 108 (90 Base) MCG/ACT inhaler Inhale 2 puffs into the lungs every 4 (four) hours as needed for wheezing or shortness of breath. 12/29/20   Gerhard Munch, MD  ?budesonide-formoterol [facility]) 160-4.5 MCG/ACT inhaler Inhale 2 puffs into the lungs 2 (two) times daily. 11/30/17   Myrtie Neither, MD  ?nicotine (NICODERM CQ - DOSED IN MG/24 HOURS) 21 mg/24hr patch Place 1 patch (21 mg total) onto the skin daily. 12/29/20   Gerhard Munch, MD  ?predniSONE (DELTASONE) 20 MG tablet Take 2 tablets (40 mg total) by mouth daily with breakfast. For the next four days 12/29/20   Gerhard Munch, MD  ?   ? ?Allergies    ?Other   ? ?Review of Systems   ?Review of Systems ? ?Physical Exam ?Updated Vital Signs ?BP 133/86 (BP Location: Right Arm)   Pulse (!) 104   Temp 98.1 ?F (36.7 ?C) (Oral)   Resp 18   Ht 5\' 8"  (1.727 m)   Wt 72.6 kg   SpO2 100%   BMI 24.33 kg/m?  ?Physical Exam ?Vitals and  nursing note reviewed.  ?Constitutional:   ?   General: He is not in acute distress. ?   Appearance: He is well-developed.  ?HENT:  ?   Head:  ?   Comments: Hematoma on the top of the head ?Eyes:  ?   Extraocular Movements: Extraocular movements intact.  ?   Conjunctiva/sclera: Conjunctivae normal.  ?   Pupils: Pupils are equal, round, and reactive to light.  ?Cardiovascular:  ?   Rate and Rhythm: Normal rate and regular rhythm.  ?   Pulses: Normal pulses.  ?   Heart sounds: Normal heart sounds. No murmur heard. ?Pulmonary:  ?   Effort: Pulmonary effort is normal. No respiratory distress.  ?   Breath sounds: Normal breath sounds.  ?Abdominal:  ?   Palpations: Abdomen is soft.  ?   Tenderness: There is no abdominal tenderness.  ?Musculoskeletal:     ?   General: No swelling.  ?   Cervical back: Neck supple.  ?Skin: ?   General: Skin is warm and dry.  ?   Capillary Refill: Capillary refill takes less than 2 seconds.  ?Neurological:  ?   General: No focal deficit present.  ?   Mental Status: He is alert and oriented to person, place, and time.  ?   Cranial Nerves: No cranial nerve deficit.  ?  Sensory: No sensory deficit.  ?   Motor: No weakness.  ?   Coordination: Coordination normal.  ?   Comments: 5+ out of 5 strength throughout, normal sensation, no drift, normal finger-nose-finger, normal speech, normal gait  ?Psychiatric:     ?   Mood and Affect: Mood normal.  ? ? ?ED Results / Procedures / Treatments   ?Labs ?(all labs ordered are listed, but only abnormal results are displayed) ?Labs Reviewed - No data to display ? ?EKG ?None ? ?Radiology ?CT Head Wo Contrast ? ?Result Date: 07/06/2021 ?CLINICAL DATA:  Head trauma, moderate to severe EXAM: CT HEAD WITHOUT CONTRAST TECHNIQUE: Contiguous axial images were obtained from the base of the skull through the vertex without intravenous contrast. RADIATION DOSE REDUCTION: This exam was performed according to the departmental dose-optimization program which includes  automated exposure control, adjustment of the mA and/or kV according to patient size and/or use of iterative reconstruction technique. COMPARISON:  None. FINDINGS: Brain: No evidence of acute infarction, hemorrhage, cerebral edema, mass, mass effect, or midline shift. No hydrocephalus or extra-axial fluid collection. Vascular: No hyperdense vessel. Skull: Normal. Negative for fracture or focal lesion. Sinuses/Orbits: No acute finding. Other: The mastoid air cells are well aerated. IMPRESSION: IMPRESSION No acute intracranial process. Electronically Signed   By: Wiliam Ke M.D.   On: 07/06/2021 14:14   ? ?Procedures ?Procedures  ? ? ?Medications Ordered in ED ?Medications - No data to display ? ?ED Course/ Medical Decision Making/ A&P ?  ?                        ?Medical Decision Making ?Amount and/or Complexity of Data Reviewed ?Radiology: ordered. ? ? ?[name] is here with headache.  Normal vitals.  No fever.  Patient hit his head yesterday while at work.  Hit his head on the window.  Denies loss of consciousness.  No blood thinners.  No neck pain or extremity pain.  Overall concussion type symptoms.  He is neurologically intact.  Normal strength and sensation.  Has had some nausea, ringing in his ears.  Does have a hematoma to the top of his head.  CT scan was obtained that showed no evidence of head bleed.  Overall no concern for head bleed or other acute process.  Suspect concussion.  Recommend Tylenol, ibuprofen, Zofran.  We will have him follow-up with sports medicine if needed.  Educated about concussions.  Discharged in good condition. ? ?This chart was dictated using voice recognition software.  Despite best efforts to proofread,  errors can occur which can change the documentation meaning.  ? ? ? ? ? ? ? ?Final Clinical Impression(s) / ED Diagnoses ?Final diagnoses:  ?Minor head injury, initial encounter  ? ? ?Rx / DC Orders ?ED Discharge Orders   ? ? None  ? ?  ? ? ?  ?Virgina Norfolk,  DO ?07/06/21 1432 ? ?

## 2021-07-06 NOTE — ED Triage Notes (Signed)
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 Patient states he had nausea yesterday, but none today. ?Patient denies blurred vision. ?

## 2021-07-06 NOTE — ED Notes (Signed)
I provided reinforced discharge education based off of discharge instructions. Pt acknowledged and understood my education. Pt had no further questions/concerns for provider/myself.  °

## 2021-07-06 NOTE — Discharge Instructions (Addendum)
Head CT is normal.  Overall suspect may be mild concussion.  Recommend Tylenol and ibuprofen.  Follow-up with concussion specialist if needed. ?

## 2022-07-05 ENCOUNTER — Ambulatory Visit (HOSPITAL_COMMUNITY)
Admission: EM | Admit: 2022-07-05 | Discharge: 2022-07-05 | Disposition: A | Discharge status: Home / Self Care | Payer: Self-pay | Attending: Nurse Practitioner | Admitting: Nurse Practitioner

## 2022-07-05 ENCOUNTER — Encounter (HOSPITAL_COMMUNITY): Payer: Self-pay

## 2022-07-05 DIAGNOSIS — Z23 Encounter for immunization: Secondary | ICD-10-CM

## 2022-07-05 DIAGNOSIS — L02511 Cutaneous abscess of right hand: Secondary | ICD-10-CM

## 2022-07-05 MED ORDER — DOXYCYCLINE HYCLATE 100 MG PO CAPS: 100.0000 mg | ORAL_CAPSULE | Freq: Two times a day (BID) | ORAL | 0 refills | Status: AC

## 2022-07-05 MED ORDER — TETANUS-DIPHTH-ACELL PERTUSSIS 5-2.5-18.5 LF-MCG/0.5 IM SUSY
0.5000 mL | PREFILLED_SYRINGE | Freq: Once | INTRAMUSCULAR | Status: AC
  Administered 2022-07-05: 0.5 mL via INTRAMUSCULAR

## 2022-07-05 MED ORDER — TETANUS-DIPHTH-ACELL PERTUSSIS 5-2.5-18.5 LF-MCG/0.5 IM SUSY
PREFILLED_SYRINGE | INTRAMUSCULAR | Status: AC
  Filled 2022-07-05: qty 0.5

## 2022-07-05 NOTE — Discharge Instructions (Signed)
We have updated your tetanus shot today Please start the antibiotics and take them as prescribed to treat any infection in your finger I have opened the area today to allow it to drain-this should drain for the next couple of days Start warm soaks twice daily at home, keep covered with bandage after

## 2022-07-05 NOTE — ED Provider Notes (Signed)
[place]    CSN: [id number] Arrival date & time: 07/05/22  1210      History   Chief Complaint No chief complaint on file.   HPI Raymond Neal is a 32 y.o. male.   Patient presents today for splinter to the right middle finger that he got last week at work.  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  Reports the area has closed up and he fears a splinter stuck in his finger.  Also reports the area is developing pain and swelling and redness.  Reports he missed work today because it was hurting.  Last Tdap unknown.    Past Medical History:  Diagnosis Date   Asthma     Patient Active Problem List   Diagnosis Date Noted   Tobacco abuse 11/28/2017   Hypokalemia 11/28/2017   Asthma exacerbation 11/28/2017   Acute respiratory failure with hypoxia ([place]) 11/28/2017   Acute bronchitis 07/14/2013   Acute asthma exacerbation 07/13/2013   Tachycardia 07/13/2013   Asthma 02/10/2011    Past Surgical History:  Procedure Laterality Date   CLAVICLE SURGERY Right 2014       Home Medications    Prior to Admission medications   Medication Sig Start Date End Date Taking? Authorizing Provider  doxycycline (VIBRAMYCIN) 100 MG capsule Take 1 capsule (100 mg total) by mouth 2 (two) times daily for 7 days. 07/05/22 07/12/22 Yes Eulogio Bear, NP  albuterol (PROVENTIL) (2.5 MG/3ML) 0.083% nebulizer solution Take 3 mLs (2.5 mg total) by nebulization every 6 (six) hours as needed for wheezing or shortness of breath. 12/29/20   Carmin Muskrat, MD  albuterol (VENTOLIN HFA) 108 (90 Base) MCG/ACT inhaler Inhale 2 puffs into the lungs every 4 (four) hours as needed for wheezing or shortness of breath. 12/29/20   Carmin Muskrat, MD  budesonide-formoterol [facility]) 160-4.5 MCG/ACT inhaler Inhale 2 puffs into the lungs 2 (two) times daily. 11/30/17   Cristal Deer, MD  nicotine (NICODERM CQ - DOSED IN MG/24 HOURS) 21 mg/24hr patch Place 1 patch (21 mg total) onto the skin daily.  12/29/20   Carmin Muskrat, MD  predniSONE (DELTASONE) 20 MG tablet Take 2 tablets (40 mg total) by mouth daily with breakfast. For the next four days 12/29/20   Carmin Muskrat, MD    Family History Family History  Problem Relation Age of Onset   Hypertension Father     Social History Social History   Tobacco Use   Smoking status: Every Day    Packs/day: 0.50    Years: 0.00    Additional pack years: 0.00    Total pack years: 0.00    Types: Cigarettes   Smokeless tobacco: Never  Vaping Use   Vaping Use: Never used  Substance Use Topics   Alcohol use: No   Drug use: Yes    Frequency: 1.0 times per week    Types: Marijuana     Allergies   Other   Review of Systems Review of Systems Per HPI  Physical Exam Triage Vital Signs ED Triage Vitals  Enc Vitals Group     BP 07/05/22 1322 126/81     Pulse Rate 07/05/22 1322 92     Resp 07/05/22 1322 14     Temp 07/05/22 1322 97.8 F (36.6 C)     Temp Source 07/05/22 1322 Oral     SpO2 07/05/22 1322 95 %     Weight --      Height --  Head Circumference --      Peak Flow --      Pain Score 07/05/22 1324 5     Pain Loc --      Pain Edu? --      Excl. in [place]? --    No data found.  Updated Vital Signs BP 126/81 (BP Location: Left Arm)   Pulse 92   Temp 97.8 F (36.6 C) (Oral)   Resp 14   SpO2 95%   Visual Acuity Right Eye Distance:   Left Eye Distance:   Bilateral Distance:    Right Eye Near:   Left Eye Near:    Bilateral Near:     Physical Exam Vitals and nursing note reviewed.  Constitutional:      General: He is not in acute distress.    Appearance: Normal appearance. He is not toxic-appearing.  Pulmonary:     Effort: Pulmonary effort is normal. No respiratory distress.  Musculoskeletal:       Hands:     Right lower leg: No edema.     Left lower leg: No edema.     Comments: Swelling to distal end of right third finger; there appears to be a developing abscess that is slightly fluctuant,  tender to touch.  Approximately 1 cm x 1 cm of swelling.  There is a dark indurated center, suspected foreign body.  Skin:    General: Skin is warm and dry.     Capillary Refill: Capillary refill takes less than 2 seconds.     Coloration: Skin is not jaundiced or pale.     Findings: No erythema.  Neurological:     Mental Status: He is alert and oriented to person, place, and time.  Psychiatric:        Behavior: Behavior is cooperative.      UC Treatments / Results  Labs (all labs ordered are listed, but only abnormal results are displayed) Labs Reviewed - No data to display  EKG   Radiology No results found.  Procedures Incision and Drainage  Date/Time: 07/05/2022 2:04 PM  Performed by: Eulogio Bear, NP Authorized by: Eulogio Bear, NP   Consent:    Consent obtained:  Verbal   Consent given by:  Patient   Risks, benefits, and alternatives were discussed: yes     Risks discussed:  Infection, incomplete drainage, pain and bleeding   Alternatives discussed:  Alternative treatment Universal protocol:    Procedure explained and questions answered to patient or proxy's satisfaction: yes     Patient identity confirmed:  Verbally with patient Location:    Type:  Abscess   Size:  1 cm x 1 cm   Location:  Upper extremity   Upper extremity location:  Finger   Finger location:  R long finger Pre-procedure details:    Skin preparation:  Antiseptic wash Anesthesia:    Anesthesia method:  None Procedure type:    Complexity:  Simple Procedure details:    Incision types:  Stab incision   Incision depth:  Dermal   Drainage:  Purulent   Drainage amount:  Scant   Wound treatment:  Wound left open   Packing materials:  None Post-procedure details:    Procedure completion:  Tolerated well, no immediate complications  (including critical care time)  Medications Ordered in UC Medications  Tdap (BOOSTRIX) injection 0.5 mL (0.5 mLs Intramuscular Given 07/05/22 1353)     Initial Impression / Assessment and Plan / UC Course  I have reviewed the  triage vital signs and the nursing notes.  Pertinent labs & imaging results that were available during my care of the patient were reviewed by me and considered in my medical decision making (see chart for details).   Patient is well-appearing, normotensive, afebrile, not tachycardic, not tachypneic, oxygenating well on room air.   1. Abscess of finger of right hand I&D of abscess as above 2 x 2 gauze and tape placed over area of I&D No foreign body able to be removed Start doxycycline to cover for infection Encouraged warm soaks at home to help allow foreign body to move out on its own ER and return precautions discussed Note given for work   The patient was given the opportunity to ask questions.  All questions answered to their satisfaction.  The patient is in agreement to this plan.   Final Clinical Impressions(s) / UC Diagnoses   Final diagnoses:  Abscess of finger of right hand     Discharge Instructions      We have updated your tetanus shot today Please start the antibiotics and take them as prescribed to treat any infection in your finger I have opened the area today to allow it to drain-this should drain for the next couple of days Start warm soaks twice daily at home, keep covered with bandage after    ED Prescriptions     Medication Sig Dispense Auth. Provider   doxycycline (VIBRAMYCIN) 100 MG capsule Take 1 capsule (100 mg total) by mouth 2 (two) times daily for 7 days. 14 capsule Eulogio Bear, NP      PDMP not reviewed this encounter.   Eulogio Bear, NP 07/05/22 1406

## 2022-07-05 NOTE — ED Triage Notes (Signed)
Patient reports that he has a splinter in the right middle finger. Patient hs swelling and pain to the finger.

## 2023-01-01 IMAGING — CT CT HEAD W/O CM
3 series · 16 of 47 positions shown, 19 images · non-contrast
Comparison: None.

CLINICAL DATA: Head trauma, moderate to severe



[Series 2: head wo · axial · 0.44mm/px · z∈[-53,+77]mm · 10 of 32 slices shown, 13 images]
[im 3/32  brain]
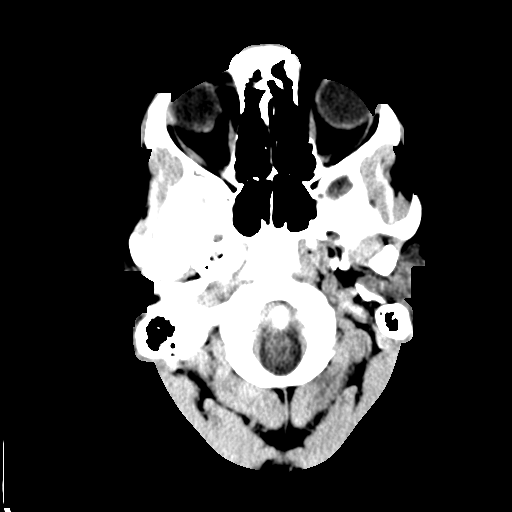
[im 3/32  bone]
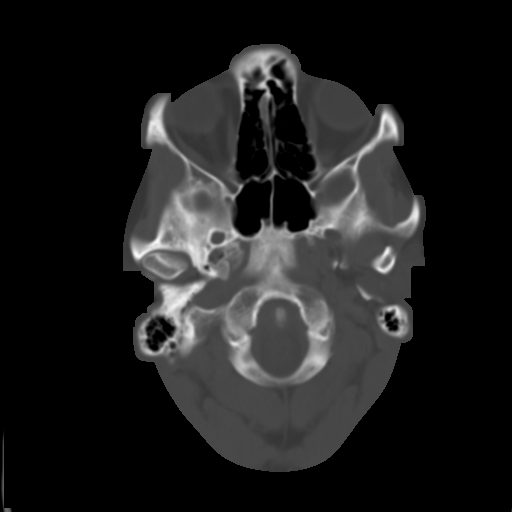
[im 6/32  brain]
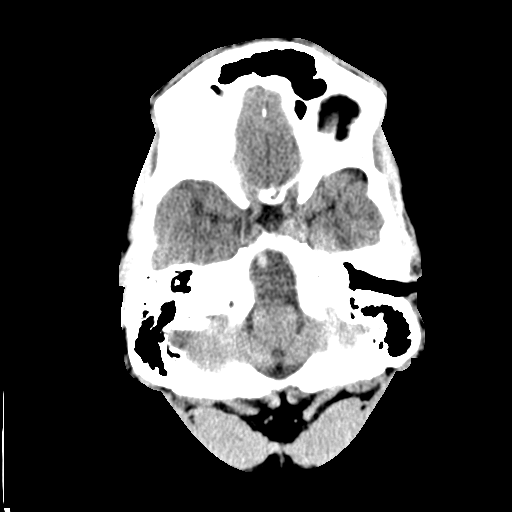
[im 9/32  brain]
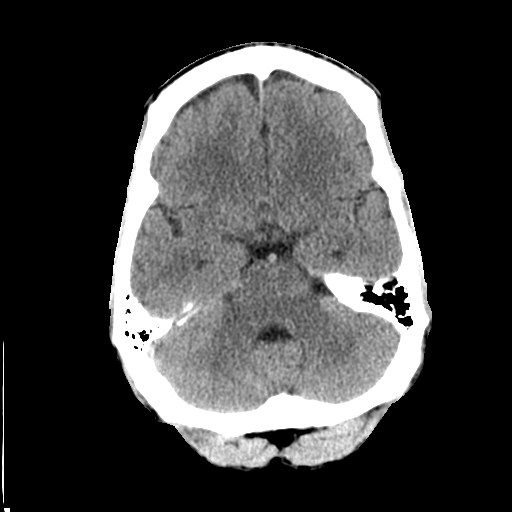
[im 11/32  brain]
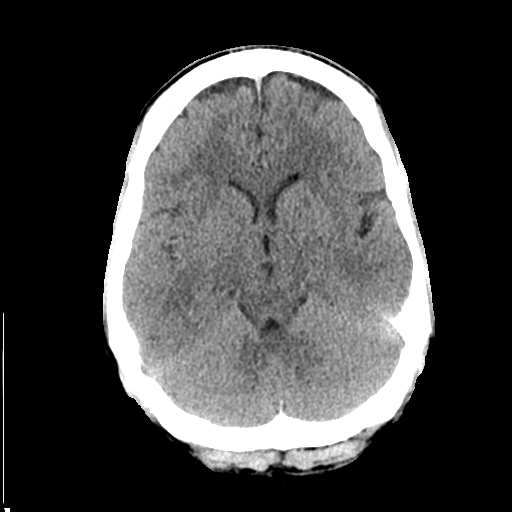
[im 14/32  brain]
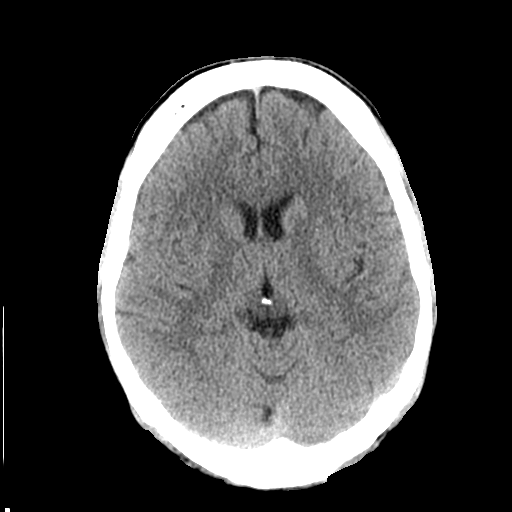
[im 14/32  bone]
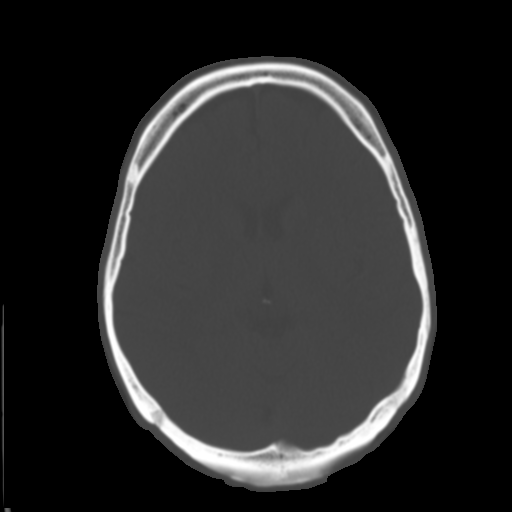
[im 18/32  brain]
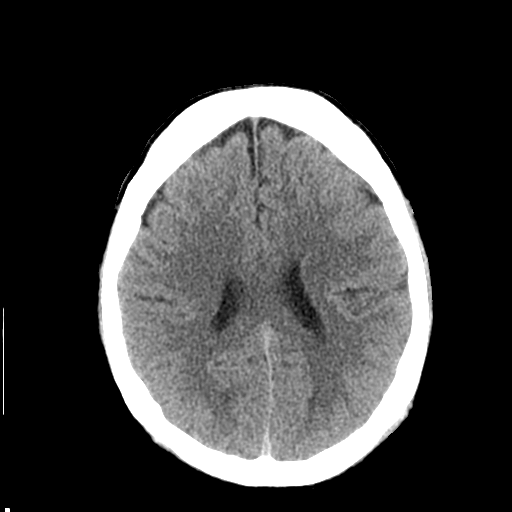
[im 21/32  brain]
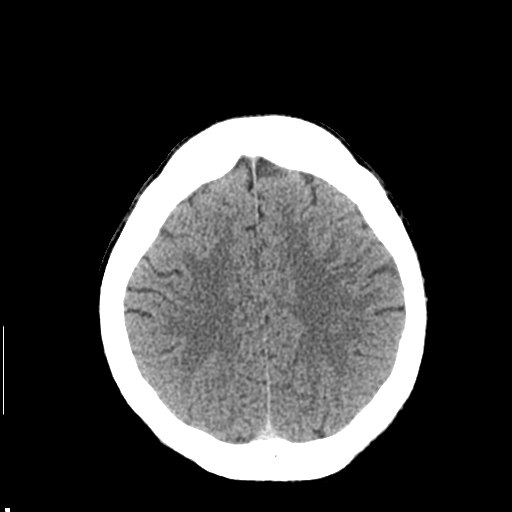
[im 24/32  brain]
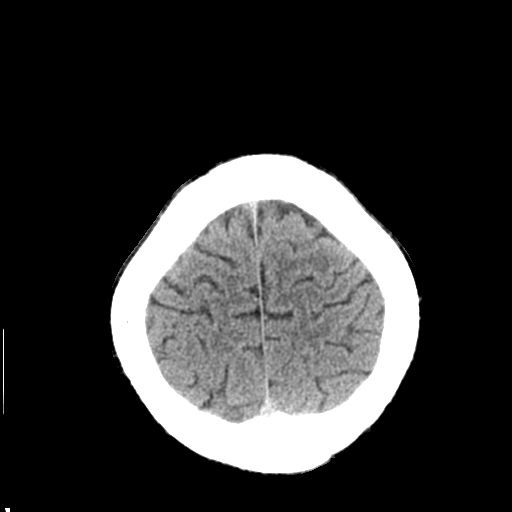
[im 26/32  brain]
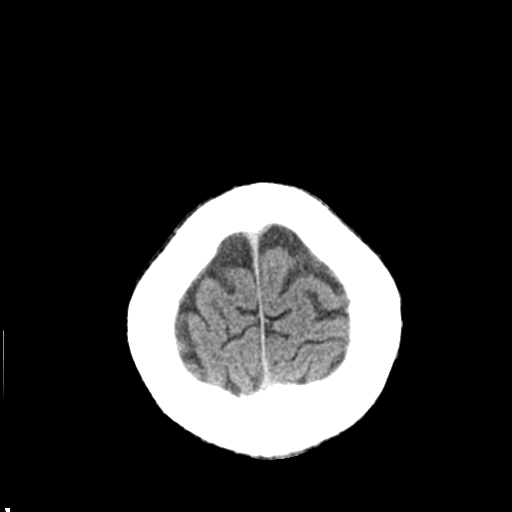
[im 26/32  bone]
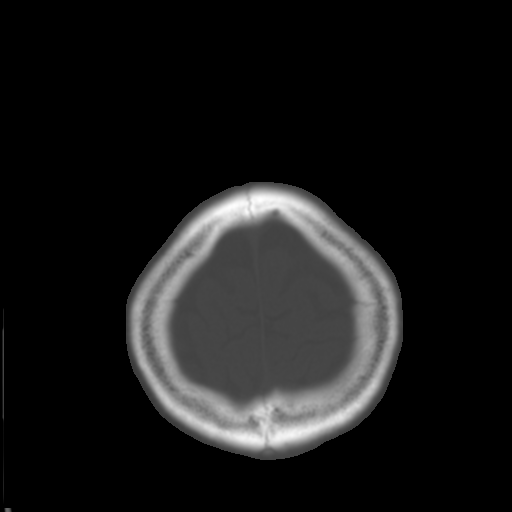
[im 29/32  brain]
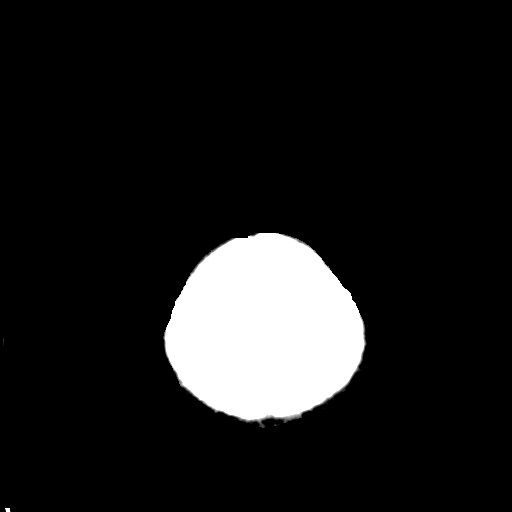

[Series 5: coronal soft tissue · coronal · 0.30mm/px · 3 of 67 slices shown]
[im 23/67  brain]
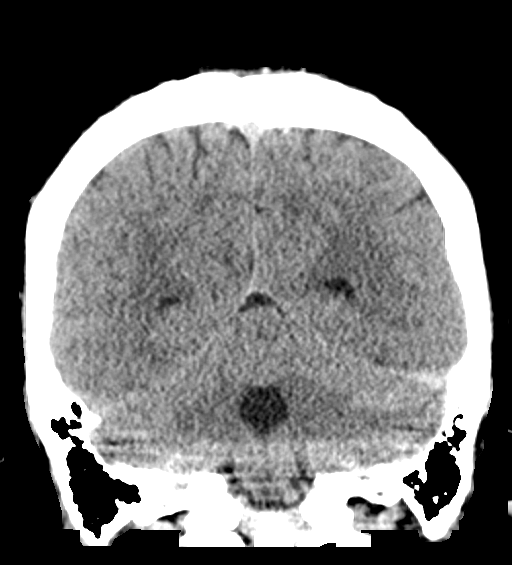
[im 30/67  brain]
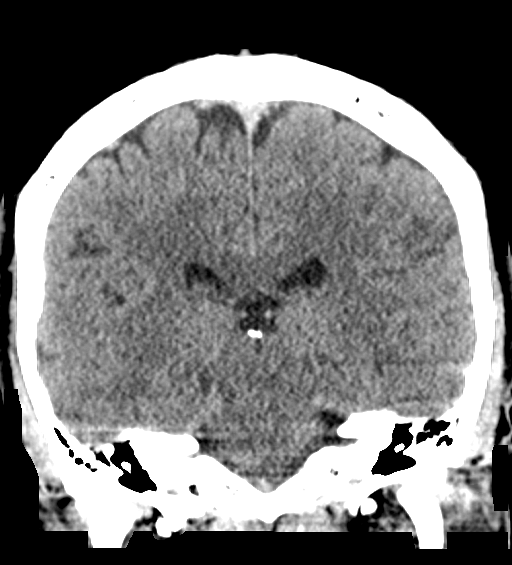
[im 37/67  brain]
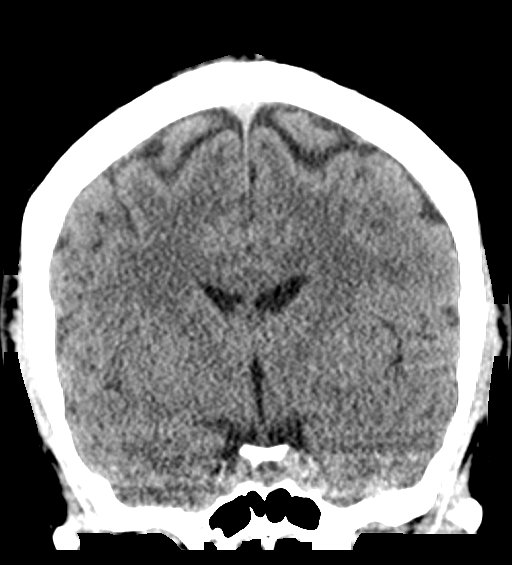

[Series 6: sagittal soft tissue · sagittal · 0.33mm/px · 3 of 52 slices shown]
[im 18/52  brain]
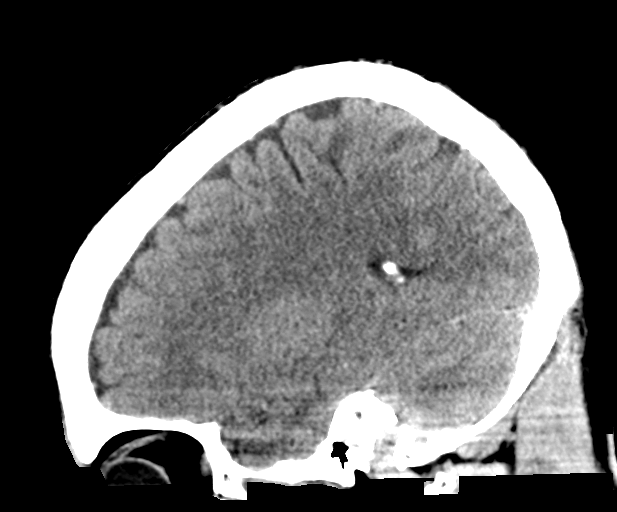
[im 26/52  brain]
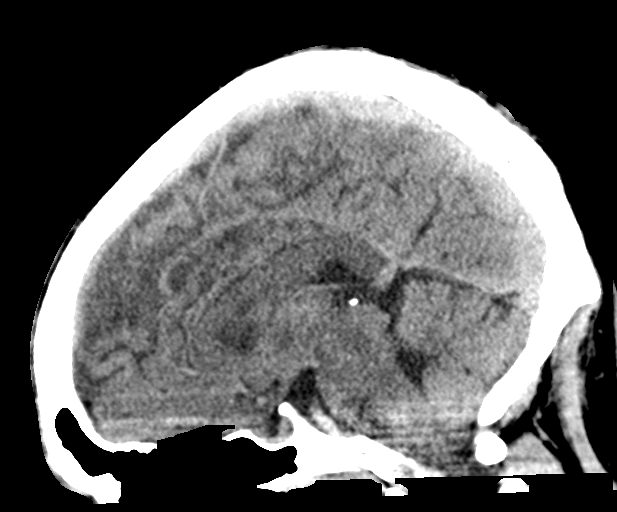
[im 35/52  brain]
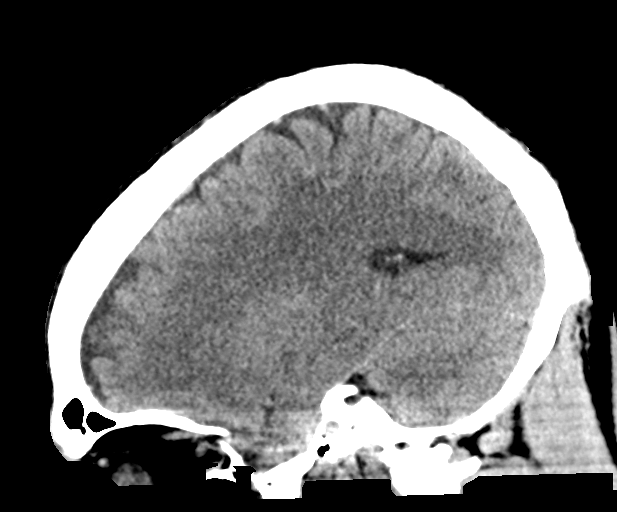

[16 of 47 positions shown; findings below may reference images not displayed]

FINDINGS: Brain: No evidence of acute infarction, hemorrhage, cerebral edema,
mass, mass effect, or midline shift. No hydrocephalus or extra-axial
fluid collection.

Vascular: No hyperdense vessel.

Skull: Normal. Negative for fracture or focal lesion.

Sinuses/Orbits: No acute finding.

Other: The mastoid air cells are well aerated.
IMPRESSION: IMPRESSION
No acute intracranial process.

## 2023-06-10 ENCOUNTER — Emergency Department (HOSPITAL_BASED_OUTPATIENT_CLINIC_OR_DEPARTMENT_OTHER)
Admission: EM | Admit: 2023-06-10 | Discharge: 2023-06-10 | Disposition: A | Discharge status: Home / Self Care | Payer: Self-pay | Attending: Emergency Medicine | Admitting: Emergency Medicine

## 2023-06-10 ENCOUNTER — Telehealth (HOSPITAL_BASED_OUTPATIENT_CLINIC_OR_DEPARTMENT_OTHER): Payer: Self-pay | Admitting: Emergency Medicine

## 2023-06-10 ENCOUNTER — Encounter (HOSPITAL_BASED_OUTPATIENT_CLINIC_OR_DEPARTMENT_OTHER): Payer: Self-pay | Admitting: Emergency Medicine

## 2023-06-10 ENCOUNTER — Other Ambulatory Visit: Payer: Self-pay

## 2023-06-10 ENCOUNTER — Emergency Department (HOSPITAL_BASED_OUTPATIENT_CLINIC_OR_DEPARTMENT_OTHER): Payer: Self-pay

## 2023-06-10 DIAGNOSIS — M549 Dorsalgia, unspecified: Secondary | ICD-10-CM

## 2023-06-10 DIAGNOSIS — M546 Pain in thoracic spine: Secondary | ICD-10-CM | POA: Insufficient documentation

## 2023-06-10 MED ORDER — KETOROLAC TROMETHAMINE 15 MG/ML IJ SOLN
15.0000 mg | Freq: Once | INTRAMUSCULAR | Status: AC
  Administered 2023-06-10: 15 mg via INTRAMUSCULAR
  Filled 2023-06-10: qty 1

## 2023-06-10 MED ORDER — OXYCODONE HCL 5 MG PO TABS
5.0000 mg | ORAL_TABLET | Freq: Once | ORAL | Status: AC
  Administered 2023-06-10: 5 mg via ORAL
  Filled 2023-06-10: qty 1

## 2023-06-10 MED ORDER — METHOCARBAMOL 500 MG PO TABS: 500.0000 mg | ORAL_TABLET | Freq: Two times a day (BID) | ORAL | 0 refills | Status: AC

## 2023-06-10 MED ORDER — METHOCARBAMOL 500 MG PO TABS
500.0000 mg | ORAL_TABLET | Freq: Two times a day (BID) | ORAL | 0 refills | Status: DC
Start: 2023-06-10 — End: 2023-06-10

## 2023-06-10 MED ORDER — ACETAMINOPHEN 500 MG PO TABS
1000.0000 mg | ORAL_TABLET | Freq: Once | ORAL | Status: AC
  Administered 2023-06-10: 1000 mg via ORAL
  Filled 2023-06-10: qty 2

## 2023-06-10 NOTE — ED Provider Notes (Signed)
 [place] [facility] Provider Note   CSN: [id number] Arrival date & time: 06/10/23  0530     History  Chief Complaint  Patient presents with   Back Pain    Raymond Neal is a 32 y.o. male.  33 yo M with a chief complaint of right thoracic back pain.  This has been going on for about 4 days now.  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  Worse with twisting turning palpation.  Has been coughing a little bit and is worse with coughing.  No difficulty breathing.  Atraumatic.   Back Pain      Home Medications Prior to Admission medications   Medication Sig Start Date End Date Taking? Authorizing Provider  methocarbamol (ROBAXIN) 500 MG tablet Take 1 tablet (500 mg total) by mouth 2 (two) times daily. 06/10/23  Yes Melene Plan, DO  albuterol (PROVENTIL) (2.5 MG/3ML) 0.083% nebulizer solution Take 3 mLs (2.5 mg total) by nebulization every 6 (six) hours as needed for wheezing or shortness of breath. 12/29/20   Gerhard Munch, MD  albuterol (VENTOLIN HFA) 108 (90 Base) MCG/ACT inhaler Inhale 2 puffs into the lungs every 4 (four) hours as needed for wheezing or shortness of breath. 12/29/20   Gerhard Munch, MD  budesonide-formoterol [facility]) 160-4.5 MCG/ACT inhaler Inhale 2 puffs into the lungs 2 (two) times daily. 11/30/17   Myrtie Neither, MD  nicotine (NICODERM CQ - DOSED IN MG/24 HOURS) 21 mg/24hr patch Place 1 patch (21 mg total) onto the skin daily. 12/29/20   Gerhard Munch, MD  predniSONE (DELTASONE) 20 MG tablet Take 2 tablets (40 mg total) by mouth daily with breakfast. For the next four days 12/29/20   Gerhard Munch, MD      Allergies    Other    Review of Systems   Review of Systems  Musculoskeletal:  Positive for back pain.    Physical Exam Updated Vital Signs BP 125/78 (BP Location: Right Arm)   Pulse 90   Temp 97.9 F (36.6 C)   Resp 20   Ht 5\' 6"   (1.676 m)   Wt 68.9 kg   SpO2 98%   BMI 24.53 kg/m  Physical Exam Vitals and nursing note reviewed.  Constitutional:      Appearance: He is well-developed.  HENT:     Head: Normocephalic and atraumatic.  Eyes:     Pupils: Pupils are equal, round, and reactive to light.  Neck:     Vascular: No JVD.  Cardiovascular:     Rate and Rhythm: Normal rate and regular rhythm.     Heart sounds: No murmur heard.    No friction rub. No gallop.  Pulmonary:     Effort: No respiratory distress.     Breath sounds: No wheezing.  Abdominal:     General: There is no distension.     Tenderness: There is no abdominal tenderness. There is no guarding or rebound.  Musculoskeletal:        General: Normal range of motion.     Cervical back: Normal range of motion and neck supple.     Comments: He points to his right thoracic back about the mid scapular line about ribs 7 through 10.  Skin:    Coloration: Skin is not pale.     Findings: No rash.  Neurological:     Mental Status: He is alert and oriented  to person, place, and time.  Psychiatric:        Behavior: Behavior normal.     ED Results / Procedures / Treatments   Labs (all labs ordered are listed, but only abnormal results are displayed) Labs Reviewed - No data to display  EKG None  Radiology DG Chest [facility] 1 View Result Date: 06/10/2023 CLINICAL DATA:  Right-sided chest pain. EXAM: PORTABLE CHEST 1 VIEW COMPARISON:  12/29/2020 FINDINGS: The heart size and mediastinal contours are within normal limits. Both lungs are clear. The visualized skeletal structures are unremarkable. IMPRESSION: No active disease. Electronically Signed   By: Kennith Center M.D.   On: 06/10/2023 06:30    Procedures Procedures    Medications Ordered in ED Medications  acetaminophen (TYLENOL) tablet 1,000 mg (1,000 mg Oral Given 06/10/23 1610)  ketorolac (TORADOL) 15 MG/ML injection 15 mg (15 mg Intramuscular Given 06/10/23 0609)  oxyCODONE (Oxy IR/ROXICODONE)  immediate release tablet 5 mg (5 mg Oral Given 06/10/23 0608)    ED Course/ Medical Decision Making/ A&P                                 Medical Decision Making Amount and/or Complexity of Data Reviewed Radiology: ordered.  Risk OTC drugs. Prescription drug management.   33 yo M with a chief complaints of right-sided back pain.  This been going on for about 4 days now.  Atraumatic.  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  Pain is worse with twisting turning.  Most likely musculoskeletal.  Will obtain a chest x-ray.  Chest x-ray independently interpreted by me without focal infiltrate or pneumothorax.  Will discharge home.  PCP follow-up.  6:56 AM:  I have discussed the diagnosis/risks/treatment options with the patient and family.  Evaluation and diagnostic testing in the emergency department does not suggest an emergent condition requiring admission or immediate intervention beyond what has been performed at this time.  They will follow up with PCP. We also discussed returning to the ED immediately if new or worsening sx occur. We discussed the sx which are most concerning (e.g., sudden worsening pain, fever, inability to tolerate by mouth) that necessitate immediate return. Medications administered to the patient during their visit and any new prescriptions provided to the patient are listed below.  Medications given during this visit Medications  acetaminophen (TYLENOL) tablet 1,000 mg (1,000 mg Oral Given 06/10/23 0608)  ketorolac (TORADOL) 15 MG/ML injection 15 mg (15 mg Intramuscular Given 06/10/23 0609)  oxyCODONE (Oxy IR/ROXICODONE) immediate release tablet 5 mg (5 mg Oral Given 06/10/23 0608)     The patient appears reasonably screen and/or stabilized for discharge and I doubt any other medical condition or other [facility] requiring further screening, evaluation, or treatment in the ED at this time prior to discharge.          Final Clinical  Impression(s) / ED Diagnoses Final diagnoses:  Acute right-sided thoracic back pain    Rx / DC Orders ED Discharge Orders          Ordered    methocarbamol (ROBAXIN) 500 MG tablet  2 times daily        06/10/23 0637              Melene Plan, DO 06/10/23 [phone]

## 2023-06-10 NOTE — ED Notes (Signed)
 Pt called after d/c requesting change in pharmacy for prescription received earlier today.  PA Joushua made aware and was able to resend prescription to [facility] on [address], per pt request.  Pt called and notified of change.

## 2023-06-10 NOTE — ED Triage Notes (Signed)
 Pt reports R sided mid back pain that started on Friday. He states it was severe at onset and has been constant since. He reports trying Motrin and heat without relief. 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 Pain worse with movement, cough and deep breath. Denies other sx.

## 2023-06-10 NOTE — Telephone Encounter (Cosign Needed)
 Patient called requesting that his Robaxin be sent to [facility] [address] in [place].  I canceled his earlier prescription of Robaxin and res[facility] [place] as he requested.

## 2023-06-10 NOTE — Discharge Instructions (Addendum)
 Your xray looks good!  Follow up with your doctor in the office. Take 4 over the counter ibuprofen tablets 3 times a day or 2 over-the-counter naproxen tablets twice a day for pain. Also take tylenol 1000mg (2 extra strength) four times a day.

## 2023-06-21 ENCOUNTER — Other Ambulatory Visit: Payer: Self-pay

## 2023-06-21 ENCOUNTER — Encounter (HOSPITAL_BASED_OUTPATIENT_CLINIC_OR_DEPARTMENT_OTHER): Payer: Self-pay | Admitting: Emergency Medicine

## 2023-06-21 ENCOUNTER — Emergency Department (HOSPITAL_BASED_OUTPATIENT_CLINIC_OR_DEPARTMENT_OTHER)
Admission: EM | Admit: 2023-06-21 | Discharge: 2023-06-21 | Disposition: A | Discharge status: Home / Self Care | Payer: Self-pay | Attending: Emergency Medicine | Admitting: Emergency Medicine

## 2023-06-21 DIAGNOSIS — K0889 Other specified disorders of teeth and supporting structures: Secondary | ICD-10-CM | POA: Insufficient documentation

## 2023-06-21 MED ORDER — NAPROXEN 500 MG PO TABS: 500.0000 mg | ORAL_TABLET | Freq: Two times a day (BID) | ORAL | 0 refills | Status: AC

## 2023-06-21 MED ORDER — AMOXICILLIN 500 MG PO CAPS: 500.0000 mg | ORAL_CAPSULE | Freq: Three times a day (TID) | ORAL | 0 refills | Status: AC

## 2023-06-21 MED ORDER — HYDROCODONE-ACETAMINOPHEN 5-325 MG PO TABS
1.0000 | ORAL_TABLET | Freq: Once | ORAL | Status: AC
  Administered 2023-06-21: 1 via ORAL
  Filled 2023-06-21: qty 1

## 2023-06-21 NOTE — ED Triage Notes (Signed)
 Pt reports top right molar cracked yesterday and he is unable to eat d/t pain

## 2023-06-21 NOTE — ED Provider Notes (Signed)
 [place] [facility] Provider Note   CSN: [id number] Arrival date & time: 06/21/23  1231     History  Chief Complaint  Patient presents with   Dental Pain    Raymond Neal is a 33 y.o. male.  Patient presents emergency department for evaluation of right upper dental pain.  Patient has a cracked molar, "for some time".  He had increasing pain yesterday with some mild facial swelling.  He is having difficulty chewing due to pain.  He is able to swallow.  No fevers.  He has taken ibuprofen without improvement.       Home Medications Prior to Admission medications   Medication Sig Start Date End Date Taking? Authorizing Provider  amoxicillin (AMOXIL) 500 MG capsule Take 1 capsule (500 mg total) by mouth 3 (three) times daily. 06/21/23  Yes Renne Crigler, PA-C  naproxen (NAPROSYN) 500 MG tablet Take 1 tablet (500 mg total) by mouth 2 (two) times daily. 06/21/23  Yes Renne Crigler, PA-C  albuterol (PROVENTIL) (2.5 MG/3ML) 0.083% nebulizer solution Take 3 mLs (2.5 mg total) by nebulization every 6 (six) hours as needed for wheezing or shortness of breath. 12/29/20   Gerhard Munch, MD  albuterol (VENTOLIN HFA) 108 (90 Base) MCG/ACT inhaler Inhale 2 puffs into the lungs every 4 (four) hours as needed for wheezing or shortness of breath. 12/29/20   Gerhard Munch, MD  budesonide-formoterol [facility]) 160-4.5 MCG/ACT inhaler Inhale 2 puffs into the lungs 2 (two) times daily. 11/30/17   Myrtie Neither, MD  methocarbamol (ROBAXIN) 500 MG tablet Take 1 tablet (500 mg total) by mouth 2 (two) times daily. 06/10/23   Gareth Eagle, PA-C  nicotine (NICODERM CQ - DOSED IN MG/24 HOURS) 21 mg/24hr patch Place 1 patch (21 mg total) onto the skin daily. 12/29/20   Gerhard Munch, MD  predniSONE (DELTASONE) 20 MG tablet Take 2 tablets (40 mg total) by mouth daily with breakfast. For the next four days 12/29/20   Gerhard Munch, MD      Allergies    Other     Review of Systems   Review of Systems  Physical Exam Updated Vital Signs BP 108/83 (BP Location: Left Arm)   Pulse 91   Temp 97.9 F (36.6 C)   Resp 16   Ht 5\' 6"  (1.676 m)   Wt 68 kg   SpO2 98%   BMI 24.21 kg/m  Physical Exam Vitals and nursing note reviewed.  Constitutional:      Appearance: He is well-developed.  HENT:     Head: Normocephalic and atraumatic.     Jaw: No trismus.     Right Ear: Tympanic membrane, ear canal and external ear normal.     Left Ear: Tympanic membrane, ear canal and external ear normal.     Nose: Nose normal.     Mouth/Throat:     Dentition: Abnormal dentition. Dental caries present. No dental abscesses.     Pharynx: Uvula midline. No uvula swelling.     Tonsils: No tonsillar abscesses.     Comments: Patient with multiple dental caries.  Tooth #3 is broken in half.  Associated tenderness to palpation.  There is also a lower right molar which is broken to the gumline.  Mild facial swelling without palpable abscess. Eyes:     Pupils: Pupils are equal, round, and reactive to light.  Neck:     Comments: No neck swelling or Lugwig's angina Musculoskeletal:     Cervical back:  Normal range of motion and neck supple.  Skin:    General: Skin is warm and dry.  Neurological:     Mental Status: He is alert.  Psychiatric:        Mood and Affect: Mood normal.     ED Results / Procedures / Treatments   Labs (all labs ordered are listed, but only abnormal results are displayed) Labs Reviewed - No data to display  EKG None  Radiology No results found.  Procedures Procedures    Medications Ordered in ED Medications  HYDROcodone-acetaminophen (NORCO/VICODIN) 5-325 MG per tablet 1 tablet (has no administration in time range)    ED Course/ Medical Decision Making/ A&P    Patient seen and examined. History obtained directly from patient.   Labs/EKG: None ordered.  Imaging: None ordered.  Medications/Fluids: Ordered: Vicodin x  1  Most recent vital signs reviewed and are as follows: BP 108/83 (BP Location: Left Arm)   Pulse 91   Temp 97.9 F (36.6 C)   Resp 16   Ht 5\' 6"  (1.676 m)   Wt 68 kg   SpO2 98%   BMI 24.21 kg/m   Initial impression: dental pain/dental infection  Plan: Discharge to home.   Prescriptions written for: Amoxicillin, naproxen  Other home care instructions discussed: Avoidance of chewing or other activities that makes the symptoms worse. Eat soft foods if needed and maintain good hydration.   ED return instructions discussed: Encouraged patient to return with worsening facial or neck swelling, difficulty breathing or swallowing, fever.   Follow-up instructions discussed: Patient encouraged to follow-up with provided dental referral, resources -- or their own dentist if able.                                 Medical Decision Making Risk Prescription drug management.   Patient presents for dental pain. They do not have a fever and do not appear septic. Exam unconcerning for Ludwig's angina or other deep tissue infection in neck and I do not feel that advanced imaging is indicated at this time. Low suspicion for PTA, RPA, epiglottis based on exam.   Patient will be treated for dental infection with antibiotic. Encouraged tylenol/NSAIDs as prescribed or as directed on the packaging for pain. Encouraged follow-up with a dentist for definitive and long-term management.          Final Clinical Impression(s) / ED Diagnoses Final diagnoses:  Pain, dental    Rx / DC Orders ED Discharge Orders          Ordered    naproxen (NAPROSYN) 500 MG tablet  2 times daily        06/21/23 1345    amoxicillin (AMOXIL) 500 MG capsule  3 times daily        06/21/23 1345              Renne Crigler, PA-C 06/21/23 1348    Anders Simmonds T, DO 06/21/23 1527

## 2023-06-21 NOTE — Discharge Instructions (Addendum)
 Please read and follow all provided instructions.  Your diagnoses today include:  1. Pain, dental    The exam and treatment you received today has been provided on an emergency basis only. This is not a substitute for complete medical or dental care.  Tests performed today include: Vital signs. See below for your results today.   Medications prescribed:  Amoxicillin - antibiotic  You have been prescribed an antibiotic medicine: take the entire course of medicine even if you are feeling better. Stopping early can cause the antibiotic not to work.  Naproxen - anti-inflammatory pain medication Do not exceed 500mg  naproxen every 12 hours, take with food  You have been prescribed an anti-inflammatory medication or NSAID. Take with food. Take smallest effective dose for the shortest duration needed for your pain. Stop taking if you experience stomach pain or vomiting.   Take any prescribed medications only as directed.  Home care instructions:  Follow any educational materials contained in this packet.  Follow-up instructions: Please follow-up with your dentist for further evaluation of your symptoms.   Dental Assistance: See attached dental referral and/or resource guide.   Return instructions:  Please return to the Emergency Department if you experience worsening symptoms. Please return if you develop a fever, you develop more swelling in your face or neck, you have trouble breathing or swallowing food. Please return if you have any other emergent concerns.  Additional Information:  Your vital signs today were: BP 108/83 (BP Location: Left Arm)   Pulse 91   Temp 97.9 F (36.6 C)   Resp 16   Ht 5\' 6"  (1.676 m)   Wt 68 kg   SpO2 98%   BMI 24.21 kg/m  If your blood pressure (BP) was elevated above 135/85 this visit, please have this repeated by your doctor within one month. --------------

## 2023-06-21 NOTE — ED Notes (Signed)
 Pt alert and oriented X 4 at the time of discharge. RR even and unlabored. No acute distress noted. Pt verbalized understanding of discharge instructions as discussed. Pt ambulatory to lobby at time of discharge.
